# Patient Record
Sex: Female | Born: 1940 | Race: White | Hispanic: No | Marital: Single | Smoking: Never smoker
Health system: Southern US, Community
[De-identification: ages and names within clinical notes are randomized; demographics above are authoritative.]

## PROBLEM LIST (undated history)

## (undated) DIAGNOSIS — E079 Disorder of thyroid, unspecified: Secondary | ICD-10-CM

## (undated) DIAGNOSIS — Z85038 Personal history of other malignant neoplasm of large intestine: Secondary | ICD-10-CM

## (undated) DIAGNOSIS — C801 Malignant (primary) neoplasm, unspecified: Secondary | ICD-10-CM

## (undated) DIAGNOSIS — F0280 Dementia in other diseases classified elsewhere without behavioral disturbance: Secondary | ICD-10-CM

## (undated) DIAGNOSIS — Z853 Personal history of malignant neoplasm of breast: Secondary | ICD-10-CM

## (undated) DIAGNOSIS — G309 Alzheimer's disease, unspecified: Secondary | ICD-10-CM

## (undated) DIAGNOSIS — Z8541 Personal history of malignant neoplasm of cervix uteri: Secondary | ICD-10-CM

## (undated) HISTORY — PX: COLOSTOMY: SHX63

## (undated) HISTORY — PX: OTHER SURGICAL HISTORY: SHX169

---

## 2004-10-15 ENCOUNTER — Ambulatory Visit: Payer: Self-pay | Admitting: Obstetrics and Gynecology

## 2005-01-05 ENCOUNTER — Ambulatory Visit: Payer: Self-pay | Admitting: Unknown Physician Specialty

## 2005-10-18 ENCOUNTER — Ambulatory Visit: Payer: Self-pay | Admitting: Obstetrics and Gynecology

## 2005-11-04 ENCOUNTER — Ambulatory Visit: Payer: Self-pay | Admitting: Obstetrics and Gynecology

## 2006-10-20 ENCOUNTER — Ambulatory Visit: Payer: Self-pay | Admitting: Obstetrics and Gynecology

## 2007-08-15 ENCOUNTER — Ambulatory Visit: Payer: Self-pay | Admitting: Ophthalmology

## 2007-08-15 ENCOUNTER — Other Ambulatory Visit: Payer: Self-pay

## 2007-08-29 ENCOUNTER — Ambulatory Visit: Payer: Self-pay | Admitting: Ophthalmology

## 2007-10-24 ENCOUNTER — Ambulatory Visit: Payer: Self-pay | Admitting: Obstetrics and Gynecology

## 2007-11-17 ENCOUNTER — Emergency Department: Payer: Self-pay | Admitting: Emergency Medicine

## 2008-03-27 ENCOUNTER — Ambulatory Visit: Payer: Self-pay | Admitting: Unknown Physician Specialty

## 2009-01-22 ENCOUNTER — Ambulatory Visit: Payer: Self-pay | Admitting: Internal Medicine

## 2009-09-10 ENCOUNTER — Ambulatory Visit: Payer: Self-pay | Admitting: Ophthalmology

## 2009-09-23 ENCOUNTER — Ambulatory Visit: Payer: Self-pay | Admitting: Ophthalmology

## 2010-05-12 ENCOUNTER — Ambulatory Visit: Payer: Self-pay | Admitting: Internal Medicine

## 2011-05-19 ENCOUNTER — Ambulatory Visit: Payer: Self-pay | Admitting: Internal Medicine

## 2011-05-31 ENCOUNTER — Ambulatory Visit: Payer: Self-pay | Admitting: Internal Medicine

## 2011-07-05 ENCOUNTER — Ambulatory Visit: Payer: Self-pay | Admitting: Surgery

## 2011-07-20 ENCOUNTER — Ambulatory Visit: Payer: Self-pay | Admitting: Surgery

## 2011-07-26 ENCOUNTER — Ambulatory Visit: Payer: Self-pay | Admitting: Surgery

## 2011-07-27 LAB — PLATELET COUNT: Platelet: 234 10*3/uL (ref 150–440)

## 2011-07-28 LAB — PATHOLOGY REPORT

## 2011-08-02 LAB — PATHOLOGY REPORT

## 2012-05-30 ENCOUNTER — Ambulatory Visit: Payer: Self-pay | Admitting: Internal Medicine

## 2012-07-04 IMAGING — CR US BIOPSY BREAST CORE VACUUM ASSIST
7 of 8 series · 7 of 8 positions shown · non-contrast
Comparison: none

REASON FOR EXAM: RT BRST MICROCALS
COMMENTS:

[CC (1 of 7)]
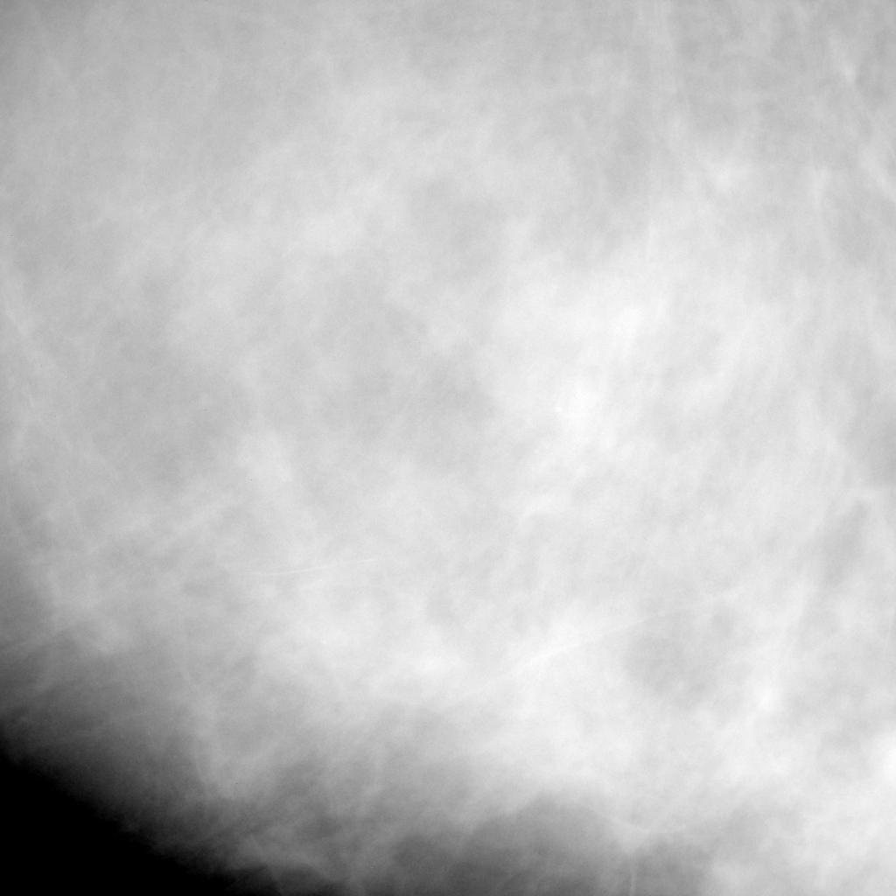

[CC (2 of 7)]
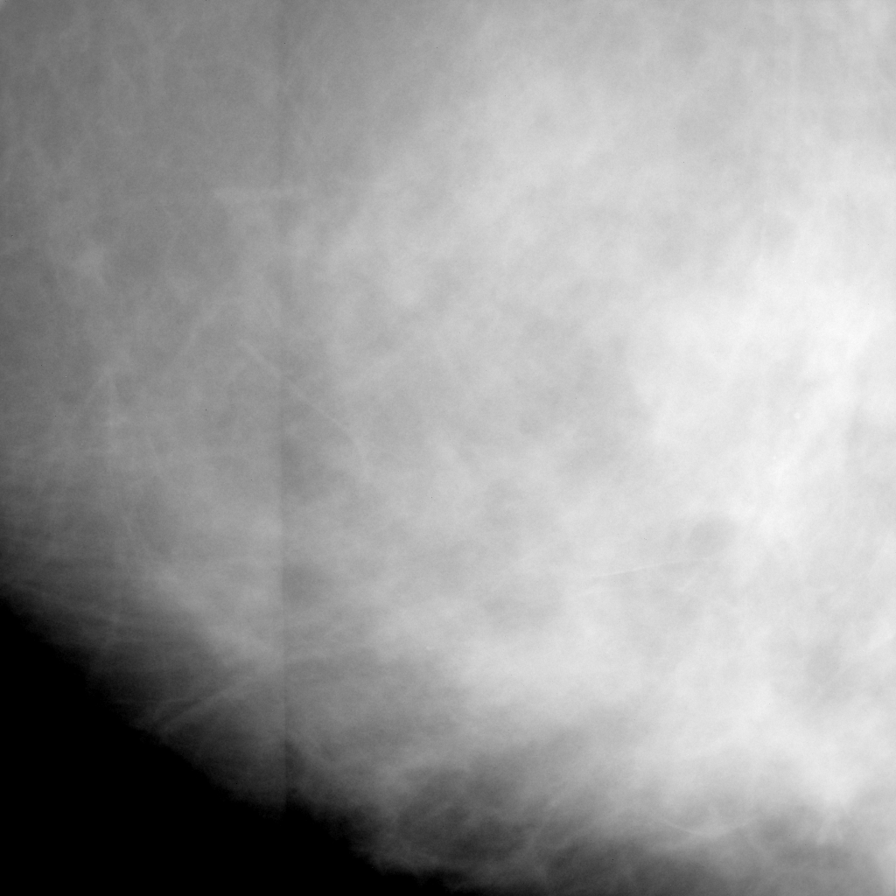

[CC (3 of 7)]
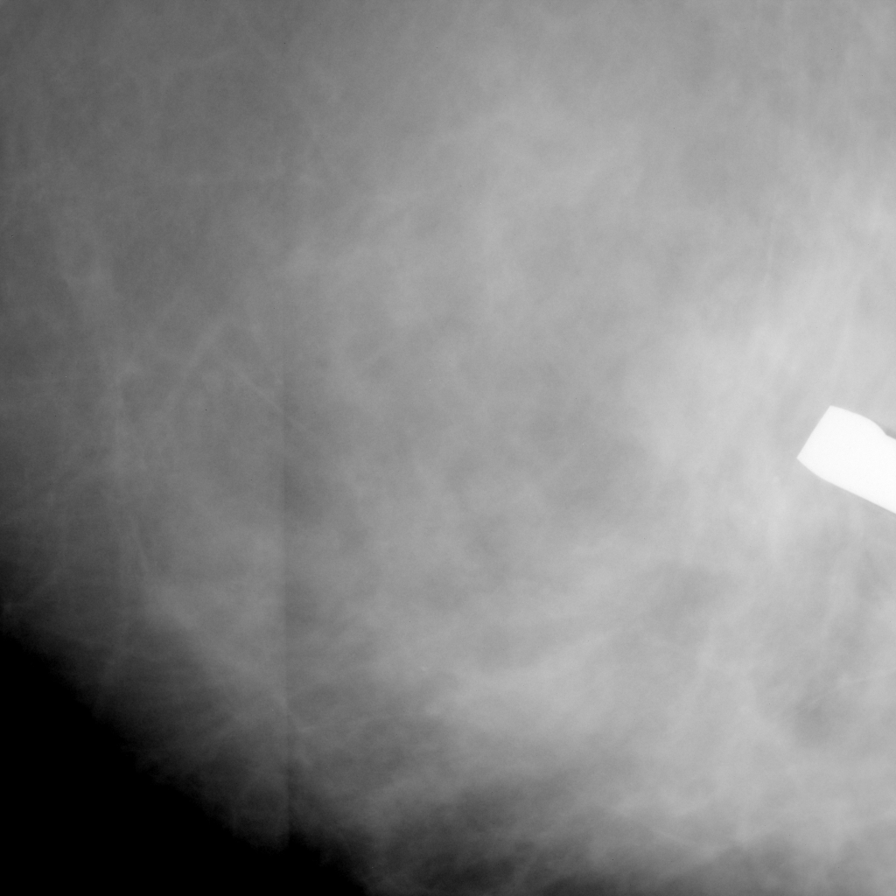

[CC (4 of 7)]
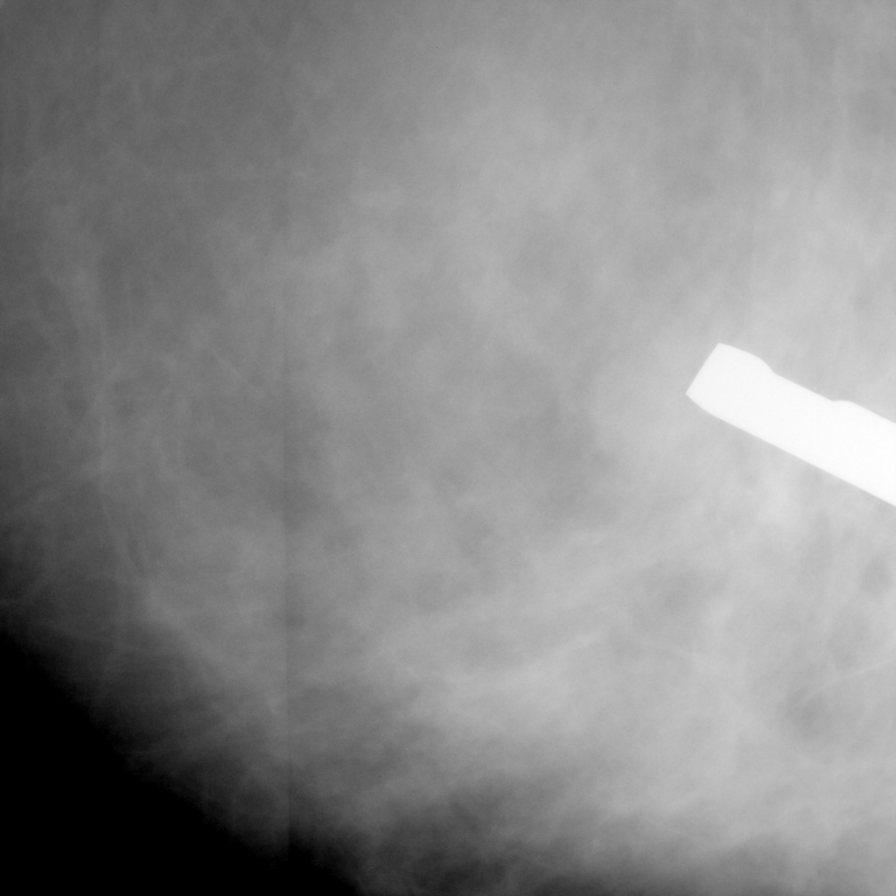

[CC (5 of 7)]
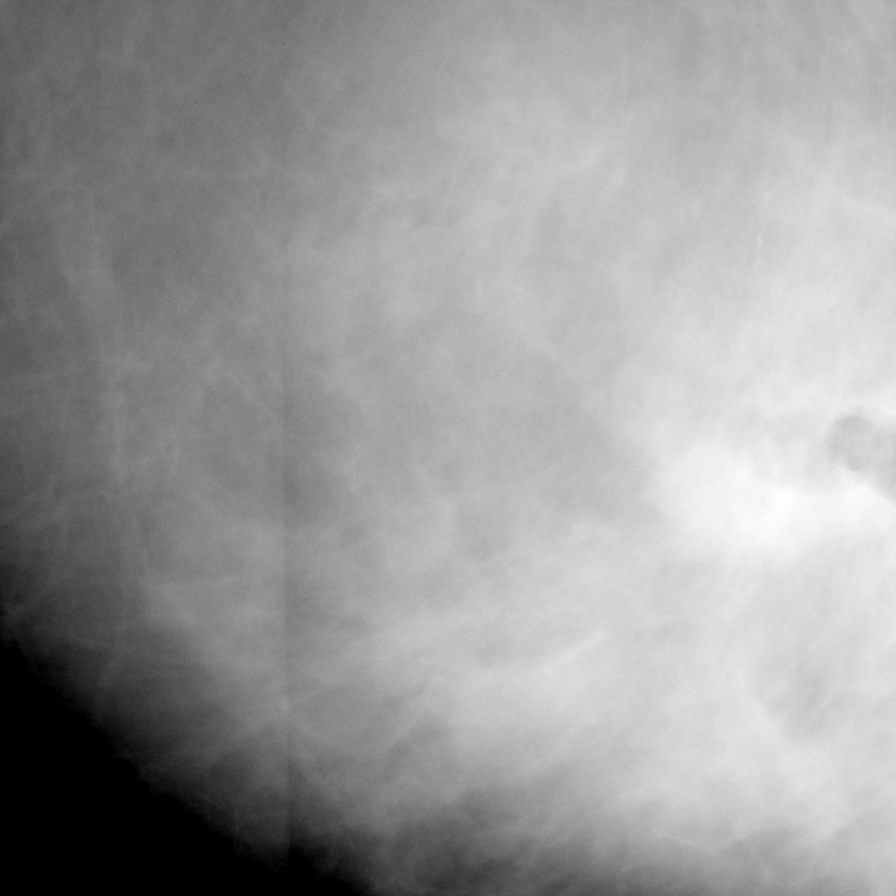

[CC (6 of 7)]
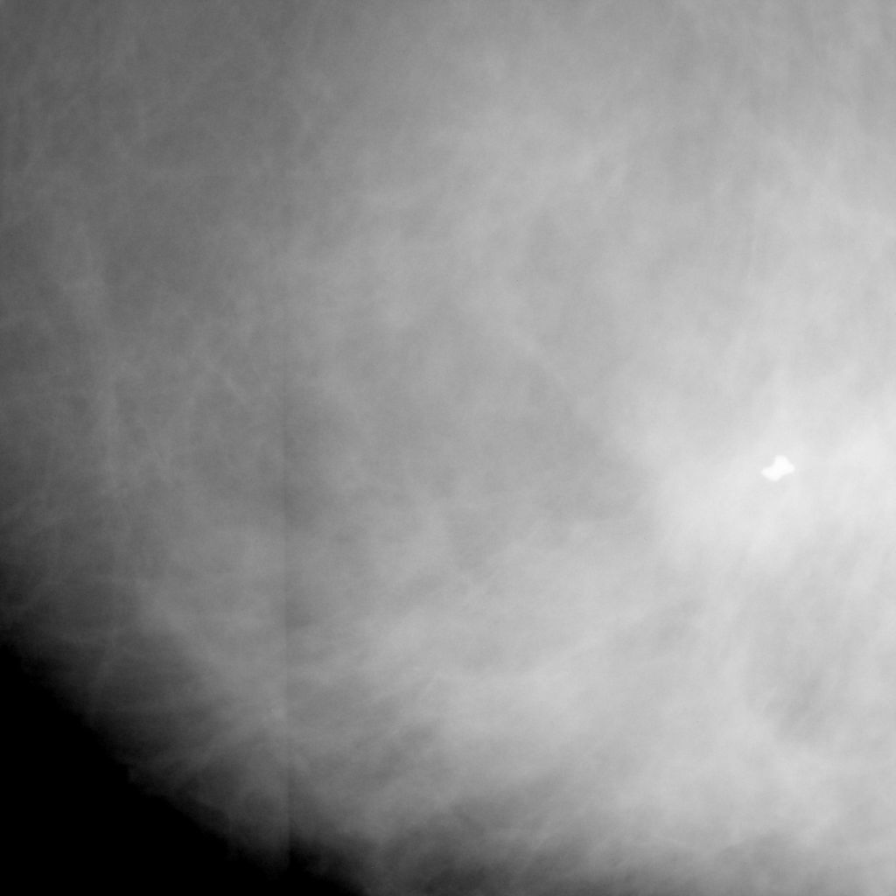

[CC (7 of 7)]
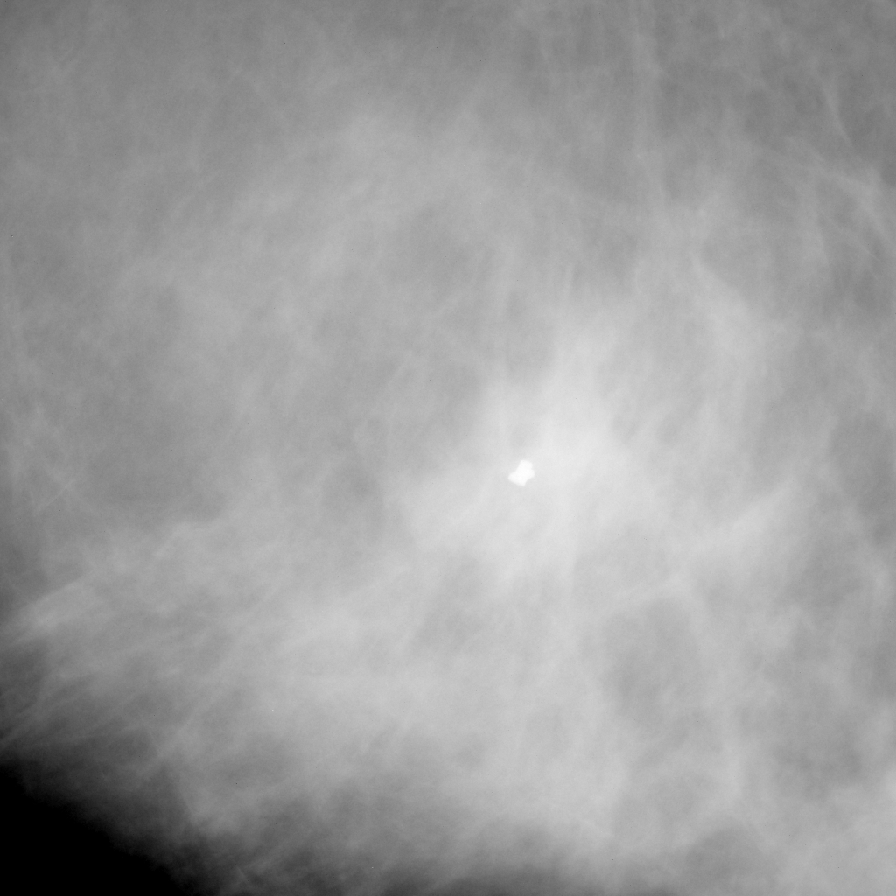

[7 of 8 positions shown; findings below may reference images not displayed]

PROCEDURE:     MAM - MAM STEREOTACTIC VACUUM ASSIST R  - July 05, 2011 [DATE]

RESULT:     The anticipated procedure was discussed with Ms. Erxleben. She
voiced her willingness to proceed. A timeout procedure was called. The skin
of the right breast was marked with an indelible marker. The skin was
cleansed with iodine solution and the subcutaneous tissues infiltrated with
approximately 2 cc of 1% buffered lidocaine. Subsequently a skin incision
was made and the stereotactic biopsy needle was placed under stereotactic
guidance. Positioning was confirmed and the needle deployed. Multiple core
samples were obtained. The specimen radiograph demonstrated the presence of
the intended targeted microcalcifications. A marker clip was deployed and
the needle withdrawn.
IMPRESSION: The patient underwent successful stereotactically directed
biopsy of microcalcifications in the right breast. The biopsy results will
go to Dr Elymalai. Should the biopsy prove benign, the patient will undergo
six-month followup mammography of the right breast to assure ongoing
stability.

Dictation location one

## 2014-04-12 DIAGNOSIS — E538 Deficiency of other specified B group vitamins: Secondary | ICD-10-CM | POA: Diagnosis not present

## 2014-06-30 NOTE — Op Note (Signed)
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   PROCEDURE: Right simple mastectomy.   ANESTHESIA: General.   SURGEON: Loreli Dollar, MD   INDICATIONS: This 74 year old female recently had an abnormal mammogram depicting a small nodule in the upper aspect of the right breast with microcalcifications. She had a core biopsy of this which demonstrated ductal carcinoma in-situ and she preferred to have a mastectomy for her definitive treatment.   DESCRIPTION OF PROCEDURE: The patient was placed on the operating table in the supine position under general anesthesia. The right arm was placed on a lateral arm support. The right breast was prepared with ChloraPrep and draped in a sterile manner.   An incision was made from medial to lateral above and below the areola and carried down through subcutaneous tissues. Next, skin and subcutaneous flaps were raised superiorly in the direction of the clavicle medially to the sternum, inferiorly to the inferior mammary fold, and laterally to the latissimus dorsi muscle. The skin was held with silk sutures for traction. A number of bleeding points were cauterized. A number of bleeding points were ligated with 3-0 Vicryl. The breast was dissected off the underlying fascia using electrocautery dissecting from medial to lateral and dissected out the axillary tail and tagged the lateral end of the skin ellipse with a stitch for the pathologist's orientation and submitted in formalin for routine pathology. Next, multiple clamped vessels were ligated with 3-0 Vicryl ties. Also, two vessels were ligated with 4-0 chromic suture ligatures. The wound was inspected. Several small bleeding points were cauterized. The wound was irrigated with saline solution. A small inferior  stab wound was made and a 62 [place] Blake drain was inserted and carried up to the axilla. A portion of it was cut away for sizing. Next, the skin was closed with a running 4-0 Monocryl subcuticular suture. 3-0 nylon sutures were placed to secure the drain. Next, the incision was dressed with Dermabond and also applied some Dermabond to the drain site and then after the Dermabond dried applied a gauze dressing around the drain site with 2-inch paper tape. The drain was activated and had scant serosanguineous drainage.   The patient tolerated the procedure satisfactorily and was then prepared for transfer to the recovery room.   ____________________________ Lenna Sciara. Rochel Brome, MD jws:drc D: 07/26/2011 13:26:47 ET T: 07/26/2011 13:31:59 ET JOB#: [id number]  cc: Loreli Dollar, MD, <Dictator> Loreli Dollar MD ELECTRONICALLY SIGNED 07/27/2011 7:45

## 2014-08-09 ENCOUNTER — Other Ambulatory Visit: Payer: Self-pay | Admitting: Neurology

## 2014-08-09 DIAGNOSIS — F0280 Dementia in other diseases classified elsewhere without behavioral disturbance: Secondary | ICD-10-CM

## 2014-08-09 DIAGNOSIS — G301 Alzheimer's disease with late onset: Secondary | ICD-10-CM

## 2014-08-09 DIAGNOSIS — R404 Transient alteration of awareness: Secondary | ICD-10-CM

## 2014-08-16 ENCOUNTER — Ambulatory Visit
Admission: RE | Admit: 2014-08-16 | Discharge: 2014-08-16 | Disposition: A | Discharge status: Home / Self Care | Payer: Medicare Other | Source: Ambulatory Visit | Attending: Neurology | Admitting: Neurology

## 2014-08-16 DIAGNOSIS — G301 Alzheimer's disease with late onset: Secondary | ICD-10-CM | POA: Insufficient documentation

## 2014-08-16 DIAGNOSIS — G311 Senile degeneration of brain, not elsewhere classified: Secondary | ICD-10-CM | POA: Insufficient documentation

## 2014-08-16 DIAGNOSIS — R404 Transient alteration of awareness: Secondary | ICD-10-CM | POA: Insufficient documentation

## 2014-08-16 DIAGNOSIS — I739 Peripheral vascular disease, unspecified: Secondary | ICD-10-CM | POA: Insufficient documentation

## 2014-08-16 DIAGNOSIS — F0280 Dementia in other diseases classified elsewhere without behavioral disturbance: Secondary | ICD-10-CM | POA: Diagnosis present

## 2015-07-02 ENCOUNTER — Other Ambulatory Visit: Payer: Self-pay | Admitting: Internal Medicine

## 2015-07-02 DIAGNOSIS — Z1231 Encounter for screening mammogram for malignant neoplasm of breast: Secondary | ICD-10-CM

## 2015-07-09 ENCOUNTER — Ambulatory Visit: Payer: Medicare Other | Attending: Internal Medicine

## 2016-05-11 ENCOUNTER — Inpatient Hospital Stay
Admission: EM | Admit: 2016-05-11 | Discharge: 2016-05-18 | DRG: 872 | Disposition: A | Discharge status: Home / Self Care | Payer: Medicare Other | Attending: Internal Medicine | Admitting: Internal Medicine

## 2016-05-11 ENCOUNTER — Emergency Department: Payer: Medicare Other

## 2016-05-11 ENCOUNTER — Encounter: Payer: Self-pay | Admitting: Emergency Medicine

## 2016-05-11 DIAGNOSIS — Z6820 Body mass index (BMI) 20.0-20.9, adult: Secondary | ICD-10-CM

## 2016-05-11 DIAGNOSIS — A86 Unspecified viral encephalitis: Secondary | ICD-10-CM | POA: Diagnosis present

## 2016-05-11 DIAGNOSIS — G309 Alzheimer's disease, unspecified: Secondary | ICD-10-CM | POA: Diagnosis present

## 2016-05-11 DIAGNOSIS — S0011XA Contusion of right eyelid and periocular area, initial encounter: Secondary | ICD-10-CM | POA: Diagnosis present

## 2016-05-11 DIAGNOSIS — S022XXA Fracture of nasal bones, initial encounter for closed fracture: Secondary | ICD-10-CM | POA: Diagnosis present

## 2016-05-11 DIAGNOSIS — Z85038 Personal history of other malignant neoplasm of large intestine: Secondary | ICD-10-CM

## 2016-05-11 DIAGNOSIS — E039 Hypothyroidism, unspecified: Secondary | ICD-10-CM | POA: Diagnosis present

## 2016-05-11 DIAGNOSIS — Z8541 Personal history of malignant neoplasm of cervix uteri: Secondary | ICD-10-CM

## 2016-05-11 DIAGNOSIS — Z9011 Acquired absence of right breast and nipple: Secondary | ICD-10-CM | POA: Diagnosis not present

## 2016-05-11 DIAGNOSIS — E860 Dehydration: Secondary | ICD-10-CM | POA: Diagnosis present

## 2016-05-11 DIAGNOSIS — E440 Moderate protein-calorie malnutrition: Secondary | ICD-10-CM | POA: Insufficient documentation

## 2016-05-11 DIAGNOSIS — E876 Hypokalemia: Secondary | ICD-10-CM | POA: Diagnosis present

## 2016-05-11 DIAGNOSIS — S0012XA Contusion of left eyelid and periocular area, initial encounter: Secondary | ICD-10-CM | POA: Diagnosis present

## 2016-05-11 DIAGNOSIS — H409 Unspecified glaucoma: Secondary | ICD-10-CM | POA: Diagnosis present

## 2016-05-11 DIAGNOSIS — Z79899 Other long term (current) drug therapy: Secondary | ICD-10-CM | POA: Diagnosis not present

## 2016-05-11 DIAGNOSIS — Z933 Colostomy status: Secondary | ICD-10-CM | POA: Diagnosis not present

## 2016-05-11 DIAGNOSIS — G039 Meningitis, unspecified: Secondary | ICD-10-CM | POA: Diagnosis present

## 2016-05-11 DIAGNOSIS — F0280 Dementia in other diseases classified elsewhere without behavioral disturbance: Secondary | ICD-10-CM | POA: Diagnosis present

## 2016-05-11 DIAGNOSIS — W010XXA Fall on same level from slipping, tripping and stumbling without subsequent striking against object, initial encounter: Secondary | ICD-10-CM | POA: Diagnosis present

## 2016-05-11 DIAGNOSIS — R066 Hiccough: Secondary | ICD-10-CM | POA: Diagnosis not present

## 2016-05-11 DIAGNOSIS — R509 Fever, unspecified: Secondary | ICD-10-CM

## 2016-05-11 DIAGNOSIS — Z853 Personal history of malignant neoplasm of breast: Secondary | ICD-10-CM

## 2016-05-11 DIAGNOSIS — Y92014 Private driveway to single-family (private) house as the place of occurrence of the external cause: Secondary | ICD-10-CM | POA: Diagnosis not present

## 2016-05-11 DIAGNOSIS — Z66 Do not resuscitate: Secondary | ICD-10-CM | POA: Diagnosis present

## 2016-05-11 DIAGNOSIS — T1490XA Injury, unspecified, initial encounter: Secondary | ICD-10-CM

## 2016-05-11 DIAGNOSIS — K529 Noninfective gastroenteritis and colitis, unspecified: Secondary | ICD-10-CM | POA: Diagnosis present

## 2016-05-11 DIAGNOSIS — A419 Sepsis, unspecified organism: Secondary | ICD-10-CM | POA: Diagnosis present

## 2016-05-11 DIAGNOSIS — D696 Thrombocytopenia, unspecified: Secondary | ICD-10-CM | POA: Diagnosis present

## 2016-05-11 HISTORY — DX: Personal history of other malignant neoplasm of large intestine: Z85.038

## 2016-05-11 HISTORY — DX: Disorder of thyroid, unspecified: E07.9

## 2016-05-11 HISTORY — DX: Personal history of malignant neoplasm of breast: Z85.3

## 2016-05-11 HISTORY — DX: Malignant (primary) neoplasm, unspecified: C80.1

## 2016-05-11 HISTORY — DX: Dementia in other diseases classified elsewhere, unspecified severity, without behavioral disturbance, psychotic disturbance, mood disturbance, and anxiety: F02.80

## 2016-05-11 HISTORY — DX: Alzheimer's disease, unspecified: G30.9

## 2016-05-11 HISTORY — DX: Personal history of malignant neoplasm of cervix uteri: Z85.41

## 2016-05-11 LAB — CSF CELL COUNT WITH DIFFERENTIAL
EOS CSF: 0 %
Eosinophils, CSF: 0 %
LYMPHS CSF: 42 %
Lymphs, CSF: 36 %
Monocyte-Macrophage-Spinal Fluid: 52 %
Monocyte-Macrophage-Spinal Fluid: 54 %
Other Cells, CSF: 1
Other Cells, CSF: 1
RBC Count, CSF: 0 /mm3 (ref 0–3)
RBC Count, CSF: 0 /mm3 (ref 0–3)
SEGMENTED NEUTROPHILS-CSF: 5 %
SEGMENTED NEUTROPHILS-CSF: 9 %
TUBE #: 4
Tube #: 1
WBC CSF: 50 /mm3 — AB (ref 0–5)
WBC, CSF: 13 /mm3 (ref 0–5)

## 2016-05-11 LAB — COMPREHENSIVE METABOLIC PANEL
ALBUMIN: 3.7 g/dL (ref 3.5–5.0)
ALT: 14 U/L (ref 14–54)
AST: 17 U/L (ref 15–41)
Alkaline Phosphatase: 70 U/L (ref 38–126)
Anion gap: 9 (ref 5–15)
BUN: 7 mg/dL (ref 6–20)
CHLORIDE: 95 mmol/L — AB (ref 101–111)
CO2: 31 mmol/L (ref 22–32)
Calcium: 8.5 mg/dL — ABNORMAL LOW (ref 8.9–10.3)
Creatinine, Ser: 0.94 mg/dL (ref 0.44–1.00)
GFR calc Af Amer: >60 mL/min (ref >60)
GFR, EST NON AFRICAN AMERICAN: 57 mL/min — AB (ref >60)
GLUCOSE: 172 mg/dL — AB (ref 65–99)
POTASSIUM: 3.3 mmol/L — AB (ref 3.5–5.1)
Sodium: 135 mmol/L (ref 135–145)
Total Bilirubin: 0.8 mg/dL (ref 0.3–1.2)
Total Protein: 7.2 g/dL (ref 6.5–8.1)

## 2016-05-11 LAB — URINALYSIS, COMPLETE (UACMP) WITH MICROSCOPIC
BACTERIA UA: NONE SEEN
BILIRUBIN URINE: NEGATIVE
Glucose, UA: NEGATIVE mg/dL
KETONES UR: 5 mg/dL — AB
LEUKOCYTES UA: NEGATIVE
Nitrite: NEGATIVE
PROTEIN: 100 mg/dL — AB
SQUAMOUS EPITHELIAL / LPF: NONE SEEN
Specific Gravity, Urine: 1.021 (ref 1.005–1.030)
pH: 5 (ref 5.0–8.0)

## 2016-05-11 LAB — CBC WITH DIFFERENTIAL/PLATELET
BASOS ABS: 0 10*3/uL (ref 0–0.1)
BASOS PCT: 1 %
EOS PCT: 1 %
Eosinophils Absolute: 0 10*3/uL (ref 0–0.7)
HCT: 38.3 % (ref 35.0–47.0)
Hemoglobin: 12.9 g/dL (ref 12.0–16.0)
LYMPHS PCT: 10 %
Lymphs Abs: 0.4 10*3/uL — ABNORMAL LOW (ref 1.0–3.6)
MCH: 28.1 pg (ref 26.0–34.0)
MCHC: 33.8 g/dL (ref 32.0–36.0)
MCV: 83.3 fL (ref 80.0–100.0)
Monocytes Absolute: 0.5 10*3/uL (ref 0.2–0.9)
Monocytes Relative: 11 %
NEUTROS ABS: 3.4 10*3/uL (ref 1.4–6.5)
Neutrophils Relative %: 77 %
PLATELETS: 185 10*3/uL (ref 150–440)
RBC: 4.59 MIL/uL (ref 3.80–5.20)
RDW: 14.2 % (ref 11.5–14.5)
WBC: 4.3 10*3/uL (ref 3.6–11.0)

## 2016-05-11 LAB — TSH: TSH: 3.56 u[IU]/mL (ref 0.350–4.500)

## 2016-05-11 LAB — INFLUENZA PANEL BY PCR (TYPE A & B)
INFLAPCR: NEGATIVE
INFLBPCR: NEGATIVE

## 2016-05-11 LAB — PROTEIN AND GLUCOSE, CSF
GLUCOSE CSF: 77 mg/dL — AB (ref 40–70)
Total  Protein, CSF: 48 mg/dL — ABNORMAL HIGH (ref 15–45)

## 2016-05-11 LAB — TROPONIN I

## 2016-05-11 MED ORDER — TIMOLOL HEMIHYDRATE 0.5 % OP SOLN
1.0000 [drp] | Freq: Every day | OPHTHALMIC | Status: DC
  Filled 2016-05-11: qty 5

## 2016-05-11 MED ORDER — LIDOCAINE HCL (PF) 1 % IJ SOLN
5.0000 mL | Freq: Once | INTRAMUSCULAR | Status: AC
  Administered 2016-05-11: 5 mL
  Filled 2016-05-11: qty 5

## 2016-05-11 MED ORDER — VITAMIN B-12 1000 MCG PO TABS
1000.0000 ug | ORAL_TABLET | ORAL | Status: DC
  Administered 2016-05-12 – 2016-05-18 (×4): 1000 ug via ORAL
  Filled 2016-05-11 (×4): qty 1

## 2016-05-11 MED ORDER — ENOXAPARIN SODIUM 40 MG/0.4ML ~~LOC~~ SOLN
40.0000 mg | SUBCUTANEOUS | Status: DC
  Administered 2016-05-11 – 2016-05-17 (×7): 40 mg via SUBCUTANEOUS
  Filled 2016-05-11 (×7): qty 0.4

## 2016-05-11 MED ORDER — SODIUM CHLORIDE 0.9 % IV SOLN
2.0000 g | Freq: Four times a day (QID) | INTRAVENOUS | Status: DC
  Administered 2016-05-12 (×3): 2 g via INTRAVENOUS
  Filled 2016-05-11 (×5): qty 2000

## 2016-05-11 MED ORDER — SODIUM CHLORIDE 0.9 % IV SOLN
1.0000 g | Freq: Once | INTRAVENOUS | Status: AC
  Administered 2016-05-11: 1 g via INTRAVENOUS
  Filled 2016-05-11: qty 1000

## 2016-05-11 MED ORDER — MEMANTINE HCL 10 MG PO TABS
5.0000 mg | ORAL_TABLET | Freq: Two times a day (BID) | ORAL | Status: DC
  Administered 2016-05-12 – 2016-05-17 (×11): 5 mg via ORAL
  Filled 2016-05-11 (×6): qty 1
  Filled 2016-05-11: qty 2
  Filled 2016-05-11 (×5): qty 1

## 2016-05-11 MED ORDER — DEXTROSE 5 % IV SOLN
2.0000 g | Freq: Once | INTRAVENOUS | Status: AC
  Administered 2016-05-11: 2 g via INTRAVENOUS
  Filled 2016-05-11: qty 2

## 2016-05-11 MED ORDER — ACETAMINOPHEN 325 MG PO TABS
650.0000 mg | ORAL_TABLET | Freq: Four times a day (QID) | ORAL | Status: DC | PRN
  Administered 2016-05-12 – 2016-05-16 (×3): 650 mg via ORAL
  Filled 2016-05-11 (×4): qty 2

## 2016-05-11 MED ORDER — VANCOMYCIN HCL 10 G IV SOLR
1500.0000 mg | Freq: Once | INTRAVENOUS | Status: AC
  Administered 2016-05-11: 1500 mg via INTRAVENOUS
  Filled 2016-05-11: qty 1500

## 2016-05-11 MED ORDER — ALBUTEROL SULFATE (2.5 MG/3ML) 0.083% IN NEBU: 2.5000 mg | INHALATION_SOLUTION | RESPIRATORY_TRACT | Status: DC | PRN

## 2016-05-11 MED ORDER — DEXTROSE 5 % IV SOLN
10.0000 mg/kg | Freq: Two times a day (BID) | INTRAVENOUS | Status: DC
  Administered 2016-05-12 – 2016-05-14 (×5): 545 mg via INTRAVENOUS
  Filled 2016-05-11 (×9): qty 10.9

## 2016-05-11 MED ORDER — LATANOPROST 0.005 % OP SOLN
1.0000 [drp] | Freq: Every day | OPHTHALMIC | Status: DC
  Administered 2016-05-12 – 2016-05-17 (×6): 1 [drp] via OPHTHALMIC
  Filled 2016-05-11: qty 2.5

## 2016-05-11 MED ORDER — DEXTROSE 5 % IV SOLN
2.0000 g | INTRAVENOUS | Status: DC
  Filled 2016-05-11: qty 2

## 2016-05-11 MED ORDER — POTASSIUM CHLORIDE IN NACL 20-0.9 MEQ/L-% IV SOLN
INTRAVENOUS | Status: AC
  Administered 2016-05-11: 22:00:00 via INTRAVENOUS
  Filled 2016-05-11: qty 1000

## 2016-05-11 MED ORDER — FLUTICASONE PROPIONATE 50 MCG/ACT NA SUSP
2.0000 | Freq: Every day | NASAL | Status: DC
  Administered 2016-05-12 – 2016-05-18 (×7): 2 via NASAL
  Filled 2016-05-11: qty 16

## 2016-05-11 MED ORDER — ONDANSETRON HCL 4 MG/2ML IJ SOLN: 4.0000 mg | Freq: Four times a day (QID) | INTRAMUSCULAR | Status: DC | PRN

## 2016-05-11 MED ORDER — ACETAMINOPHEN 650 MG RE SUPP: 650.0000 mg | Freq: Four times a day (QID) | RECTAL | Status: DC | PRN

## 2016-05-11 MED ORDER — ONDANSETRON HCL 4 MG PO TABS
4.0000 mg | ORAL_TABLET | Freq: Four times a day (QID) | ORAL | Status: DC | PRN
Start: 2016-05-11 — End: 2016-05-18

## 2016-05-11 MED ORDER — DEXTROSE 5 % IV SOLN: 2.0000 g | Freq: Once | INTRAVENOUS | Status: DC

## 2016-05-11 MED ORDER — DEXTROSE 5 % IV SOLN
2.0000 g | Freq: Two times a day (BID) | INTRAVENOUS | Status: DC
  Administered 2016-05-12: 04:00:00 2 g via INTRAVENOUS
  Filled 2016-05-11 (×2): qty 2

## 2016-05-11 MED ORDER — POLYETHYLENE GLYCOL 3350 17 G PO PACK: 17.0000 g | PACK | Freq: Every day | ORAL | Status: DC | PRN

## 2016-05-11 MED ORDER — VANCOMYCIN HCL IN DEXTROSE 750-5 MG/150ML-% IV SOLN
750.0000 mg | INTRAVENOUS | Status: DC
  Filled 2016-05-11: qty 150

## 2016-05-11 MED ORDER — GALANTAMINE HYDROBROMIDE 4 MG PO TABS
8.0000 mg | ORAL_TABLET | Freq: Two times a day (BID) | ORAL | Status: DC
  Administered 2016-05-12 – 2016-05-18 (×13): 8 mg via ORAL
  Filled 2016-05-11 (×15): qty 2

## 2016-05-11 MED ORDER — TRIAMCINOLONE ACETONIDE 55 MCG/ACT NA AERO: 2.0000 | INHALATION_SPRAY | Freq: Every day | NASAL | Status: DC

## 2016-05-11 MED ORDER — ONDANSETRON HCL 4 MG/2ML IJ SOLN: 4.0000 mg | Freq: Once | INTRAMUSCULAR | Status: DC

## 2016-05-11 MED ORDER — SODIUM CHLORIDE 0.9 % IV BOLUS (SEPSIS)
1000.0000 mL | Freq: Once | INTRAVENOUS | Status: AC
  Administered 2016-05-11: 1000 mL via INTRAVENOUS

## 2016-05-11 MED ORDER — ACETAMINOPHEN 500 MG PO TABS
1000.0000 mg | ORAL_TABLET | Freq: Once | ORAL | Status: AC
  Administered 2016-05-11: 1000 mg via ORAL
  Filled 2016-05-11: qty 2

## 2016-05-11 NOTE — ED Notes (Signed)
Pharm to send up omnipen, will hang when tubed to ED.

## 2016-05-11 NOTE — Consult Note (Signed)
Pharmacy Antibiotic Note  Donna Nielsen is a 76 y.o. female admitted on 05/11/2016 with possible meningitis.  Pharmacy has been consulted for vancomycin, acyclovir and ceftriaxone dosing. Pt also on ampicillin  Plan: Pt received 1500mg  of vancomycin in the ED, therefore no stacked dosing. Vancomycin 750mg  q 24 hours starting tomorrow at 2100 Acyclovir 10mg /kg actual body weight q 12 hr Ceftriaxone 2g q 12 hr vanc trough 3/10 @ 2030  Height: 5\' 4"  (162.6 cm) Weight: 115 lb 6.4 oz (52.3 kg) IBW/kg (Calculated) : 54.7  Temp (24hrs), Avg:99.1 F (37.3 C), Min:98.1 F (36.7 C), Max:100.1 F (37.8 C)   Recent Labs Lab 05/11/16 1318  WBC 4.3  CREATININE 0.94    Estimated Creatinine Clearance: 42 mL/min (by C-G formula based on SCr of 0.94 mg/dL).    No Known Allergies  Antimicrobials this admission: vancomycin 3/6 >>  ceftriaxone 3/6 >>  Ampicillin 3/6 >> Acyclovir 3/6 >>  Dose adjustments this admission:   Microbiology results: 3/6 CSF: 3/6 influenza neg  Thank you for allowing pharmacy to be a part of this patient's care.  Ramond Dial, Pharm.D, BCPS Clinical Pharmacist  05/11/2016 9:48 PM

## 2016-05-11 NOTE — H&P (Signed)
[facility] NAME: Donna Nielsen    MR#:  [id number]  DATE OF BIRTH:  1941/02/20  DATE OF ADMISSION:  05/11/2016  PRIMARY CARE PHYSICIAN: Rusty Aus, MD   REQUESTING/REFERRING PHYSICIAN: Dr. Alfred Levins  CHIEF COMPLAINT:   Chief Complaint  Patient presents with  . Fall    HISTORY OF PRESENT ILLNESS:  Donna Nielsen  is a 76 y.o. female with a known history of Hypothyroidism, Alzheimer's dementia presents to the emergency room brought in by the daughter due to worsening confusion and weakness with decreased oral intake.  Patient was seen recently 5 days back for a fall and had CT scan of the head which showed nasal bone fractures. She was started on tramadol and amoxicillin and discharged home. After going home patient has slowly gotten weaker and more confusion. Decreased oral intake. Daughter decided to bring patient back. She has not had any vomiting, diarrhea or rash, fever at home. Temperature 100.1 in the emergency room. Spinal tap shows WBCs of 50 and protein at 48.  Patient has no concerns. She is a poor historian. Chest x-ray is clear. Urinalysis shows no UTI.  PAST MEDICAL HISTORY:   Past Medical History:  Diagnosis Date  . Alzheimer disease   . History of breast cancer   . History of cervical cancer   . History of colon cancer   . Thyroid disease     PAST SURGICAL HISTORY:   Past Surgical History:  Procedure Laterality Date  . COLOSTOMY    . right mastectomy      SOCIAL HISTORY:   Social History  Substance Use Topics  . Smoking status: Never Smoker  . Smokeless tobacco: Never Used  . Alcohol use No    FAMILY HISTORY:  History reviewed. No pertinent family history.  DRUG ALLERGIES:  No Known Allergies  REVIEW OF SYSTEMS:   Review of Systems  Unable to perform ROS: Dementia    MEDICATIONS AT HOME:   Prior to Admission medications   Medication Sig Start Date End Date Taking? Authorizing Provider   amoxicillin (AMOXIL) 500 MG capsule Take 500 mg by mouth 2 (two) times daily. 05/08/16 05/17/16 Yes Historical Provider, MD  bimatoprost (LUMIGAN) 0.03 % ophthalmic solution Place 1 drop into both eyes at bedtime.   Yes Historical Provider, MD  galantamine (RAZADYNE) 8 MG tablet Take 8 mg by mouth 2 (two) times daily.   Yes Historical Provider, MD  memantine (NAMENDA) 5 MG tablet Take 5 mg by mouth 2 (two) times daily.   Yes Historical Provider, MD  timolol (BETIMOL) 0.5 % ophthalmic solution Place 1 drop into both eyes daily.   Yes Historical Provider, MD  triamcinolone (NASACORT ALLERGY 24HR) 55 MCG/ACT AERO nasal inhaler Place 2 sprays into the nose daily.   Yes Historical Provider, MD  vitamin B-12 (CYANOCOBALAMIN) 1000 MCG tablet Take 1,000 mcg by mouth every other day.   Yes Historical Provider, MD     VITAL SIGNS:  Blood pressure (!) 116/52, pulse 73, temperature 99.3 F (37.4 C), temperature source Oral, resp. rate (!) 21, height 5\' 2"  (1.575 m), weight 54.4 kg (120 lb), SpO2 95 %.  PHYSICAL EXAMINATION:  Physical Exam  GENERAL:  76 y.o.-year-old patient lying in the bed with no acute distress.  EYES: Bruising around the eyes. It was bilaterally equal and reactive to light HEENT: Trauma with bruising around the eyes and over NECK:  Supple, no jugular venous distention. No thyroid enlargement, no  tenderness.  LUNGS: Normal breath sounds bilaterally, no wheezing, rales, rhonchi. No use of accessory muscles of respiration.  CARDIOVASCULAR: S1, S2 normal. No murmurs, rubs, or gallops.  ABDOMEN: Soft, nontender, nondistended. Bowel sounds present. No organomegaly or mass.  EXTREMITIES: No pedal edema, cyanosis, or clubbing. + 2 pedal & radial pulses b/l.   NEUROLOGIC: Cranial nerves II through XII are intact. No focal Motor or sensory deficits appreciated b/l PSYCHIATRIC: The patient is alert and awake. Oriented to place but not time or person SKIN: No obvious rash, lesion, or ulcer.    LABORATORY PANEL:   CBC  Recent Labs Lab 05/11/16 1318  WBC 4.3  HGB 12.9  HCT 38.3  PLT 185   ------------------------------------------------------------------------------------------------------------------  Chemistries   Recent Labs Lab 05/11/16 1318  NA 135  K 3.3*  CL 95*  CO2 31  GLUCOSE 172*  BUN 7  CREATININE 0.94  CALCIUM 8.5*  AST 17  ALT 14  ALKPHOS 70  BILITOT 0.8   ------------------------------------------------------------------------------------------------------------------  Cardiac Enzymes  Recent Labs Lab 05/11/16 1318  TROPONINI <0.03   ------------------------------------------------------------------------------------------------------------------  RADIOLOGY:  Dg Chest 2 View  Result Date: 05/11/2016 CLINICAL DATA:  [place] Friday, now drowsy, altered mental status, history of dementia EXAM: CHEST  2 VIEW COMPARISON:  None. FINDINGS: No active infiltrate or effusion is seen. Mediastinal and hilar contours are unremarkable. The heart is borderline enlarged. No bony abnormality is seen. IMPRESSION: No active cardiopulmonary disease. Electronically Signed   By: Ivar Drape M.D.   On: 05/11/2016 17:03   Ct Head Wo Contrast  Result Date: 05/11/2016 CLINICAL DATA:  Fall Friday, head injury. Decreased responsiveness today. EXAM: CT HEAD WITHOUT CONTRAST TECHNIQUE: Contiguous axial images were obtained from the base of the skull through the vertex without intravenous contrast. COMPARISON:  CT head dated 05/07/2016.  Brain MRI dated 08/16/2014. FINDINGS: Brain: Generalized parenchymal atrophy with commensurate dilatation of the ventricles and sulci. Mild chronic small vessel ischemic change within the bilateral periventricular white matter regions. There is no mass, hemorrhage, edema or other evidence of acute parenchymal abnormality. No extra-axial hemorrhage. Vascular: There are chronic calcified atherosclerotic changes of the large vessels at the  skull base. No unexpected hyperdense vessel. Skull: Normal. Negative for fracture or focal lesion. Slightly displaced nasal bone fractures as described on the earlier CT report. Sinuses/Orbits: No acute finding. Other: Soft tissue edema overlying the left lower frontal bone, improved compared to the earlier CT. IMPRESSION: 1. No acute intracranial abnormality. No intracranial mass, hemorrhage or edema. No skull fracture. 2. Soft tissue edema overlying the lower left frontal bone, improved compared to earlier head CT of 05/07/2016. No underlying fracture. 3. Slightly displaced nasal bone fracture, as described on the earlier CT report. Electronically Signed   By: Franki Cabot M.D.   On: 05/11/2016 13:41   IMPRESSION AND PLAN:   * Meningitis Elevated WBC 50 and elevated protein. No neck rigidity This could also be reactive. We'll start vancomycin, ceftriaxone, ampicillin and acyclovir. Wait for CSF cultures and HSV PCR Consult infectious disease  * Alzheimer's dementia Watch for inpatient delirium  * Dehydration with hypokalemia due to decreased oral intake Start IV fluids.  * Weakness and worsening confusion Seems multifactorial due to acute infection, tramadol, dehydration  * Nasal bone fracture Conservative management  * DVT prophylaxis with Lovenox  All the records are reviewed and case discussed with ED provider. Management plans discussed with the patient, family and they are in agreement.  CODE STATUS: DNR  TOTAL TIME  TAKING CARE OF THIS PATIENT: 40 minutes.   Hillary Bow R M.D on 05/11/2016 at 8:31 PM  Between 7am to 6pm - Pager - [phone]  After 6pm go to [email or web address] - password [email or web address] [facility]  Office  [phone]  CC: Primary care physician; Rusty Aus, MD  Note: This dictation was prepared with Dragon dictation along with smaller phrase technology. Any transcriptional errors that result from this process are unintentional.

## 2016-05-11 NOTE — Progress Notes (Signed)
Advance care planning  Discussed with patient's daughter at bedside. She is her healthcare power of attorney. Patient due to her Alzheimer's dementia unable to make decisions.  Discussed with patient's daughter regarding treatment plan and prognosis. Course of Alzheimer's disease.  Patient has had progressive decline with her dementia over time. She has advanced directives regarding DO NOT RESUSCITATE and DO NOT INTUBATE.  Time spent 20 minutes

## 2016-05-11 NOTE — ED Triage Notes (Signed)
Pt fell Friday night and hit head.  Was seen at [facility]; had head CT.  granddaughter went and tried to wake pt up today and would not respond at all. Went back 30 min later and pulled pt up and got to shower and pt then fell asleep on toilet.  Drowsy compared to baseline per family.  Not talkative per family, slow to respond. Pt currently responding. Not on blood thinners. Disoriented to time only, hx alzheimer's. When stop talking to pt she falls asleep in triage. Raccoon eyes. If continually talk with pt will stay awake but when stop she will fall asleep. Discussed pt with dr Alfred Levins.

## 2016-05-11 NOTE — ED Provider Notes (Signed)
[facility] Emergency Department Provider Note  ____________________________________________  Time seen: Approximately 4:22 PM  I have reviewed the triage vital signs and the nursing notes.   HISTORY  Chief Complaint Fall   HPI Donna Nielsen is a 76 y.o. female with a history of Alzheimer's who presents for evaluation of altered mental status. Patient is accompanied by her daughter and granddaughter who take care of her. 4 days ago patient was walking down the driveway and she tripped and fell face forward hitting her forehead onto the concrete. She was seen at an outside hospital with CT head, face, cervical spine showing no acute intracranial abnormalities and a nasal bone fracture. She was placed on amoxicillin. Patient was doing well according to the family. Yesterday she started to become a little bit more confused and was speaking less. This morning when her granddaughter who takes care of her every morning came in she was unable to wake patient up. She was breathing and had a pulse. She thought that maybe patient was a little bit too tired and let her sleep for another hour. After that she was able to get her up however patient was very confused, not following commands, and disorientated which prompted their visit to the emergency room. Patient denies headache, chest pain, shortness of breath, abdominal pain. According to the family she has had no fever, she has been ambulating without difficulty, she has had no nausea, no vomiting, no diarrhea. She has had no cough or congestion.  Past Medical History:  Diagnosis Date  . Alzheimer disease   . History of breast cancer   . History of cervical cancer   . History of colon cancer   . Thyroid disease     There are no active problems to display for this patient.   Past Surgical History:  Procedure Laterality Date  . COLOSTOMY    . right mastectomy      Prior to Admission medications   Medication Sig  Start Date End Date Taking? Authorizing Provider  amoxicillin (AMOXIL) 500 MG capsule Take 500 mg by mouth 2 (two) times daily. 05/08/16 05/17/16 Yes Historical Provider, MD  bimatoprost (LUMIGAN) 0.03 % ophthalmic solution Place 1 drop into both eyes at bedtime.   Yes Historical Provider, MD  galantamine (RAZADYNE) 8 MG tablet Take 8 mg by mouth 2 (two) times daily.   Yes Historical Provider, MD  memantine (NAMENDA) 5 MG tablet Take 5 mg by mouth 2 (two) times daily.   Yes Historical Provider, MD  timolol (BETIMOL) 0.5 % ophthalmic solution Place 1 drop into both eyes daily.   Yes Historical Provider, MD  triamcinolone (NASACORT ALLERGY 24HR) 55 MCG/ACT AERO nasal inhaler Place 2 sprays into the nose daily.   Yes Historical Provider, MD  vitamin B-12 (CYANOCOBALAMIN) 1000 MCG tablet Take 1,000 mcg by mouth every other day.   Yes Historical Provider, MD    Allergies Patient has no known allergies.  History reviewed. No pertinent family history.  Social History Social History  Substance Use Topics  . Smoking status: Never Smoker  . Smokeless tobacco: Never Used  . Alcohol use No    Review of Systems  Constitutional: Negative for fever. + confusion Eyes: Negative for visual changes. ENT: Negative for sore throat. Neck: No neck pain  Cardiovascular: Negative for chest pain. Respiratory: Negative for shortness of breath. Gastrointestinal: Negative for abdominal pain, vomiting or diarrhea. Genitourinary: Negative for dysuria. Musculoskeletal: Negative for back pain. Skin: Negative for rash. Neurological:  Negative for headaches, weakness or numbness. Psych: No SI or HI  ____________________________________________   PHYSICAL EXAM:  VITAL SIGNS: ED Triage Vitals  Enc Vitals Group     BP 05/11/16 1317 128/69     Pulse Rate 05/11/16 1317 88     Resp 05/11/16 1317 16     Temp 05/11/16 1317 98.9 F (37.2 C)     Temp Source 05/11/16 1317 Oral     SpO2 05/11/16 1317 100 %      Weight 05/11/16 1310 120 lb (54.4 kg)     Height 05/11/16 1310 5\' 2"  (1.575 m)     Head Circumference --      Peak Flow --      Pain Score --      Pain Loc --      Pain Edu? --      Excl. in [place]? --     Constitutional: Alert and oriented x2 which is baseline, no distress, easily arousable, answers to questions appropriately.  HEENT:      Head: Normocephalic, bruising to the forehead.         Eyes: Conjunctivae are normal. Sclera is non-icteric. EOMI. PERRL. Bilateral periorbital hematomas      Mouth/Throat: Mucous membranes are moist.       Ear: No hemotympanum or battle sign      Neck: Supple with no signs of meningismus. No midline ttp Cardiovascular: Regular rate and rhythm. No murmurs, gallops, or rubs. 2+ symmetrical distal pulses are present in all extremities. No JVD. Respiratory: Normal respiratory effort. Lungs are clear to auscultation bilaterally. No wheezes, crackles, or rhonchi.  Gastrointestinal: Soft, non tender, and non distended with positive bowel sounds. No rebound or guarding. Musculoskeletal: Nontender with normal range of motion in all extremities. No edema, cyanosis, or erythema of extremities. Neurologic: Normal speech and language. A & O x3, PERRL, no nystagmus, CN II-XII intact, motor testing reveals good tone and bulk throughout. There is no evidence of pronator drift or dysmetria. Muscle strength is 5/5 throughout. Deep tendon reflexes are 2+ throughout with downgoing toes. Sensory examination is intact. Gait deferred Skin: Skin is warm, dry and intact. No rash noted. Psychiatric: Mood and affect are normal. Speech and behavior are normal.  ____________________________________________   LABS (all labs ordered are listed, but only abnormal results are displayed)  Labs Reviewed  CBC WITH DIFFERENTIAL/PLATELET - Abnormal; Notable for the following:       Result Value   Lymphs Abs 0.4 (*)    All other components within normal limits  COMPREHENSIVE METABOLIC  PANEL - Abnormal; Notable for the following:    Potassium 3.3 (*)    Chloride 95 (*)    Glucose, Bld 172 (*)    Calcium 8.5 (*)    GFR calc non Af Amer 57 (*)    All other components within normal limits  URINALYSIS, COMPLETE (UACMP) WITH MICROSCOPIC - Abnormal; Notable for the following:    Color, Urine YELLOW (*)    APPearance HAZY (*)    Hgb urine dipstick SMALL (*)    Ketones, ur 5 (*)    Protein, ur 100 (*)    All other components within normal limits  CSF CELL COUNT WITH DIFFERENTIAL - Abnormal; Notable for the following:    Appearance, CSF CLEAR (*)    WBC, CSF 50 (*)    All other components within normal limits  CSF CELL COUNT WITH DIFFERENTIAL - Abnormal; Notable for the following:    Appearance, CSF CLEAR (*)  WBC, CSF 13 (*)    All other components within normal limits  PROTEIN AND GLUCOSE, CSF - Abnormal; Notable for the following:    Glucose, CSF 77 (*)    Total  Protein, CSF 48 (*)    All other components within normal limits  CSF CULTURE  TROPONIN I  INFLUENZA PANEL BY PCR (TYPE A & B)  PATHOLOGIST SMEAR REVIEW   ____________________________________________  EKG  ED ECG REPORT I, Rudene Re, the attending physician, personally viewed and interpreted this ECG.  Normal sinus rhythm, rate of 82, normal intervals, left axis deviation, slight ST depressions on the inferior and lateral leads, no ST elevation. Slight ST depressions are new when compared to prior from 2013 ____________________________________________  RADIOLOGY  Head CT:  1. No acute intracranial abnormality. No intracranial mass, hemorrhage or edema. No skull fracture. 2. Soft tissue edema overlying the lower left frontal bone, improved compared to earlier head CT of 05/07/2016. No underlying fracture. 3. Slightly displaced nasal bone fracture, as described on the earlier CT report. ____________________________________________   PROCEDURES  Procedure(s) performed:yes .Lumbar  Puncture Date/Time: 05/11/2016 6:18 PM Performed by: Rudene Re Authorized by: Rudene Re   Consent:    Consent obtained:  Verbal   Consent given by:  Guardian   Risks discussed:  Bleeding, headache, nerve damage, infection, pain and repeat procedure Pre-procedure details:    Procedure purpose:  Diagnostic   Preparation: Patient was prepped and draped in usual sterile fashion   Anesthesia (see MAR for exact dosages):    Anesthesia method:  Local infiltration   Local anesthetic:  Lidocaine 1% w/o epi Procedure details:    Lumbar space:  L4-L5 interspace   Patient position:  R lateral decubitus   Needle gauge:  22   Needle type:  Spinal needle - Quincke tip   Number of attempts:  1   Fluid appearance:  Clear   Tubes of fluid:  4 Post-procedure:    Puncture site:  Adhesive bandage applied   Patient tolerance of procedure:  Tolerated well, no immediate complications   Critical Care performed: yes  CRITICAL CARE Performed by: Rudene Re  ?  Total critical care time: 35 min  Critical care time was exclusive of separately billable procedures and treating other patients.  Critical care was necessary to treat or prevent imminent or life-threatening deterioration.  Critical care was time spent personally by me on the following activities: development of treatment plan with patient and/or surrogate as well as nursing, discussions with consultants, evaluation of patient's response to treatment, examination of patient, obtaining history from patient or surrogate, ordering and performing treatments and interventions, ordering and review of laboratory studies, ordering and review of radiographic studies, pulse oximetry and re-evaluation of patient's condition.  ____________________________________________   INITIAL IMPRESSION / ASSESSMENT AND PLAN / ED COURSE  76 y.o. female with a history of Alzheimer's who presents for evaluation of altered mental status since  yesterday in the setting of a recent fall 4 days ago which patient sustained head trauma and nasal bone fracture. During my evaluation patient seemed warm to the touch and repeat temperature show a fever of 100.13F, patient is neurologically intact with no signs of basilar skull fracture. She does have bilateral periorbital hematoma but no tarsal plate sparing, she has no battle sign and no hemotympanum. Repeat head CT is negative for any acute findings. Patient could have a concussion from the fall however with low grade fever I feel that I must rule out an  infectious source. Patient has no other findings suggestive of an alternative infection based on history and exam. UA is negative. WBC normal. Will get Flu and CXR and if both negative, I am concerned that patient could have meningitis in the setting of recent face/ head trauma, AMS, low grade fever. Will give ceftriaxone now and continue to monitor.  Clinical Course as of May 11 1957  Tue May 11, 2016  1954 CSF studies showing 50 wbc's on tube 1 and 33 in tube 4 with no RBCs mostly lymphs but also neutrophils. Due to patient's altered mental status, fever, and recent trauma patient will be admitted until CSF cultures are back. We'll add vancomycin and ampicillin. Hospitalist will admit patient.  [CV]    Clinical Course User Index [CV] Rudene Re, MD    Pertinent labs & imaging results that were available during my care of the patient were reviewed by me and considered in my medical decision making (see chart for details).    ____________________________________________   FINAL CLINICAL IMPRESSION(S) / ED DIAGNOSES  Final diagnoses:  Meningitis      NEW MEDICATIONS STARTED DURING THIS VISIT:  New Prescriptions   No medications on file     Note:  This document was prepared using Dragon voice recognition software and may include unintentional dictation errors.    Rudene Re, MD 05/11/16 [phone]

## 2016-05-12 DIAGNOSIS — E440 Moderate protein-calorie malnutrition: Secondary | ICD-10-CM | POA: Insufficient documentation

## 2016-05-12 LAB — CBC
HCT: 42 % (ref 35.0–47.0)
Hemoglobin: 13.9 g/dL (ref 12.0–16.0)
MCH: 27.6 pg (ref 26.0–34.0)
MCHC: 33.1 g/dL (ref 32.0–36.0)
MCV: 83.5 fL (ref 80.0–100.0)
PLATELETS: 134 10*3/uL — AB (ref 150–440)
RBC: 5.03 MIL/uL (ref 3.80–5.20)
RDW: 14.2 % (ref 11.5–14.5)
WBC: 3.8 10*3/uL (ref 3.6–11.0)

## 2016-05-12 LAB — BASIC METABOLIC PANEL
Anion gap: 10 (ref 5–15)
BUN: 9 mg/dL (ref 6–20)
CALCIUM: 8.1 mg/dL — AB (ref 8.9–10.3)
CO2: 29 mmol/L (ref 22–32)
CREATININE: 0.7 mg/dL (ref 0.44–1.00)
Chloride: 97 mmol/L — ABNORMAL LOW (ref 101–111)
GFR calc Af Amer: >60 mL/min (ref >60)
Glucose, Bld: 124 mg/dL — ABNORMAL HIGH (ref 65–99)
POTASSIUM: 3.5 mmol/L (ref 3.5–5.1)
SODIUM: 136 mmol/L (ref 135–145)

## 2016-05-12 LAB — PATHOLOGIST SMEAR REVIEW

## 2016-05-12 MED ORDER — SODIUM CHLORIDE 0.9 % IV SOLN
3.0000 g | Freq: Four times a day (QID) | INTRAVENOUS | Status: DC
  Administered 2016-05-12 – 2016-05-17 (×19): 3 g via INTRAVENOUS
  Filled 2016-05-12 (×23): qty 3

## 2016-05-12 MED ORDER — TIMOLOL MALEATE 0.5 % OP SOLN
1.0000 [drp] | Freq: Every day | OPHTHALMIC | Status: DC
  Administered 2016-05-12 – 2016-05-18 (×7): 1 [drp] via OPHTHALMIC
  Filled 2016-05-12: qty 5

## 2016-05-12 MED ORDER — KCL IN DEXTROSE-NACL 20-5-0.45 MEQ/L-%-% IV SOLN
INTRAVENOUS | Status: DC
  Administered 2016-05-12 – 2016-05-17 (×9): via INTRAVENOUS
  Filled 2016-05-12 (×15): qty 1000

## 2016-05-12 MED ORDER — IBUPROFEN 200 MG PO TABS
400.0000 mg | ORAL_TABLET | Freq: Four times a day (QID) | ORAL | Status: DC | PRN
  Administered 2016-05-12: 21:00:00 400 mg via ORAL
  Filled 2016-05-12 (×2): qty 2

## 2016-05-12 MED ORDER — ENSURE ENLIVE PO LIQD
237.0000 mL | Freq: Three times a day (TID) | ORAL | Status: DC
  Administered 2016-05-12 – 2016-05-18 (×15): 237 mL via ORAL

## 2016-05-12 NOTE — Consult Note (Signed)
[facility] Infectious Disease     Reason for Consult:Meningitis     Referring Physician: Boykin Reaper Date of Admission:  05/11/2016   Active Problems:   Meningitis   HPI: Donna Nielsen is a 76 y.o. female admitted with fevers and confusion.  Patient was seen at [facility]  5 days ago for a fall when tripping on a curb. She  had CT scan of the head which showed nasal bone fractures. She was started on tramadol and amoxicillin and discharged home. After going home patient has slowly gotten weaker and more confusion. Decreased oral intake. Daughter decided to bring patient back when she was unable to be aroused. On admit temperature 100.1 in the emergency room. WBC 3.8, flu PCR neg, CXR neg,. CT head shows nasal bone fx and some soft tissue swelling.  UA neg, Spinal tap shows WBCs of 50->13 majority lymp and monos.  rbc 0.  protein at 48, glucose 77 Started on empiric meningitis treatment and IVF and much more alert per daughter at bedside.    Past Medical History:  Diagnosis Date  . Alzheimer disease   . History of breast cancer   . History of cervical cancer   . History of colon cancer   . Thyroid disease    Past Surgical History:  Procedure Laterality Date  . COLOSTOMY    . right mastectomy     Social History  Substance Use Topics  . Smoking status: Never Smoker  . Smokeless tobacco: Never Used  . Alcohol use No   History reviewed. No pertinent family history.  Allergies: No Known Allergies  Current antibiotics: Antibiotics Given (last 72 hours)    Date/Time Action Medication Dose Rate   05/12/16 0011 Given   ampicillin (OMNIPEN) 2 g in sodium chloride 0.9 % 50 mL IVPB 2 g 150 mL/hr   05/12/16 0149 Given   acyclovir (ZOVIRAX) 545 mg in dextrose 5 % 100 mL IVPB 545 mg 110.9 mL/hr   05/12/16 0335 Given   cefTRIAXone (ROCEPHIN) 2 g in dextrose 5 % 50 mL IVPB 2 g 100 mL/hr   05/12/16 0531 Given   ampicillin (OMNIPEN) 2 g in sodium chloride 0.9 % 50 mL IVPB 2 g 150  mL/hr   05/12/16 1206 Given   ampicillin (OMNIPEN) 2 g in sodium chloride 0.9 % 50 mL IVPB 2 g 150 mL/hr      MEDICATIONS: . acyclovir  10 mg/kg Intravenous Q12H  . ampicillin (OMNIPEN) IV  2 g Intravenous Q6H  . cefTRIAXone (ROCEPHIN) IVPB 2 gram/50 mL D5W (Pyxis)  2 g Intravenous Q12H  . enoxaparin (LOVENOX) injection  40 mg Subcutaneous Q24H  . feeding supplement (ENSURE ENLIVE)  237 mL Oral TID BM  . fluticasone  2 spray Each Nare Daily  . galantamine  8 mg Oral BID  . latanoprost  1 drop Both Eyes QHS  . memantine  5 mg Oral BID  . timolol  1 drop Both Eyes Daily  . vancomycin  750 mg Intravenous Q24H  . vitamin B-12  1,000 mcg Oral QODAY    Review of Systems - unable to obtain due to dementia  OBJECTIVE: Temp:  [98.1 F (36.7 C)-102.1 F (38.9 C)] 98.8 F (37.1 C) (03/07 1227) Pulse Rate:  [62-79] 72 (03/07 1227) Resp:  [13-22] 18 (03/07 1227) BP: (66-165)/(40-67) 122/44 (03/07 1227) SpO2:  [93 %-100 %] 97 % (03/07 1227) Weight:  [52.3 kg (115 lb 6.4 oz)-53.5 kg (118 lb)] 53.5 kg (118 lb) (03/07  0700) Physical Exam  Constitutional:  Thin, frail, dementia so cannot tell me where she is or year. Is able to converse clearly HENT: bilateral facial brusing periorbitally and on chin PERRLA, no scleral icterus Mouth/Throat: Oropharynx is clear and dry . No oropharyngeal exudate.  Cardiovascular: Normal rate, regular rhythm and normal heart sounds.Pulmonary/Chest: Effort normal and breath sounds normal. No respiratory distress.  has no wheezes.  Neck = supple, no nuchal rigidity Abdominal: Soft. Bowel sounds are normal.  Colostomy site wnl Lymphadenopathy: no cervical adenopathy. No axillary adenopathy Neurological: dementia, moves all 4, able to answer simple questions but does not know where she is Skin: Skin is warm and dry. brusing on face as above. Abrasions on knees bil but dry Psychiatric: a normal mood and affect. Marland Kitchen    LABS: Results for orders placed or performed  during the hospital encounter of 05/11/16 (from the past 48 hour(s))  CBC with Differential     Status: Abnormal   Collection Time: 05/11/16  1:18 PM  Result Value Ref Range   WBC 4.3 3.6 - 11.0 K/uL   RBC 4.59 3.80 - 5.20 MIL/uL   Hemoglobin 12.9 12.0 - 16.0 g/dL   HCT 38.3 35.0 - 47.0 %   MCV 83.3 80.0 - 100.0 fL   MCH 28.1 26.0 - 34.0 pg   MCHC 33.8 32.0 - 36.0 g/dL   RDW 14.2 11.5 - 14.5 %   Platelets 185 150 - 440 K/uL   Neutrophils Relative % 77 %   Neutro Abs 3.4 1.4 - 6.5 K/uL   Lymphocytes Relative 10 %   Lymphs Abs 0.4 (L) 1.0 - 3.6 K/uL   Monocytes Relative 11 %   Monocytes Absolute 0.5 0.2 - 0.9 K/uL   Eosinophils Relative 1 %   Eosinophils Absolute 0.0 0 - 0.7 K/uL   Basophils Relative 1 %   Basophils Absolute 0.0 0 - 0.1 K/uL  Comprehensive metabolic panel     Status: Abnormal   Collection Time: 05/11/16  1:18 PM  Result Value Ref Range   Sodium 135 135 - 145 mmol/L   Potassium 3.3 (L) 3.5 - 5.1 mmol/L   Chloride 95 (L) 101 - 111 mmol/L   CO2 31 22 - 32 mmol/L   Glucose, Bld 172 (H) 65 - 99 mg/dL   BUN 7 6 - 20 mg/dL   Creatinine, Ser 0.94 0.44 - 1.00 mg/dL   Calcium 8.5 (L) 8.9 - 10.3 mg/dL   Total Protein 7.2 6.5 - 8.1 g/dL   Albumin 3.7 3.5 - 5.0 g/dL   AST 17 15 - 41 U/L   ALT 14 14 - 54 U/L   Alkaline Phosphatase 70 38 - 126 U/L   Total Bilirubin 0.8 0.3 - 1.2 mg/dL   GFR calc non Af Amer 57 (L) >60 mL/min   GFR calc Af Amer >60 >60 mL/min    Comment: (NOTE) The eGFR has been calculated using the CKD EPI equation. This calculation has not been validated in all clinical situations. eGFR's persistently <60 mL/min signify possible Chronic Kidney Disease.    Anion gap 9 5 - 15  Troponin I     Status: None   Collection Time: 05/11/16  1:18 PM  Result Value Ref Range   Troponin I <0.03 <0.03 ng/mL  TSH     Status: None   Collection Time: 05/11/16  1:18 PM  Result Value Ref Range   TSH 3.560 0.350 - 4.500 uIU/mL    Comment: Performed by a 3rd  Generation assay with a functional sensitivity of <=0.01 uIU/mL.  Urinalysis, Complete w Microscopic     Status: Abnormal   Collection Time: 05/11/16  1:19 PM  Result Value Ref Range   Color, Urine YELLOW (A) YELLOW   APPearance HAZY (A) CLEAR   Specific Gravity, Urine 1.021 1.005 - 1.030   pH 5.0 5.0 - 8.0   Glucose, UA NEGATIVE NEGATIVE mg/dL   Hgb urine dipstick SMALL (A) NEGATIVE   Bilirubin Urine NEGATIVE NEGATIVE   Ketones, ur 5 (A) NEGATIVE mg/dL   Protein, ur 100 (A) NEGATIVE mg/dL   Nitrite NEGATIVE NEGATIVE   Leukocytes, UA NEGATIVE NEGATIVE   RBC / HPF 0-5 0 - 5 RBC/hpf   WBC, UA 0-5 0 - 5 WBC/hpf   Bacteria, UA NONE SEEN NONE SEEN   Squamous Epithelial / LPF NONE SEEN NONE SEEN   Mucous PRESENT    Granular Casts, UA PRESENT   Influenza panel by PCR (type A & B)     Status: None   Collection Time: 05/11/16  4:28 PM  Result Value Ref Range   Influenza A By PCR NEGATIVE NEGATIVE   Influenza B By PCR NEGATIVE NEGATIVE    Comment: (NOTE) The Xpert Xpress Flu assay is intended as an aid in the diagnosis of  influenza and should not be used as a sole basis for treatment.  This  assay is FDA approved for nasopharyngeal swab specimens only. Nasal  washings and aspirates are unacceptable for Xpert Xpress Flu testing.   CSF cell count with differential collection tube #: 1     Status: Abnormal   Collection Time: 05/11/16  6:12 PM  Result Value Ref Range   Tube # 1    Color, CSF COLORLESS COLORLESS   Appearance, CSF CLEAR (A) CLEAR   RBC Count, CSF 0 0 - 3 /cu mm   WBC, CSF 50 (HH) 0 - 5 /cu mm    Comment: CRITICAL RESULT CALLED TO, READ BACK BY AND VERIFIED WITH: Martinique LOYE AT 1947 ON 05/11/16 BY SNJ    Segmented Neutrophils-CSF 9 %   Lymphs, CSF 36 %   Monocyte-Macrophage-Spinal Fluid 54 %   Eosinophils, CSF 0 %   Other Cells, CSF 1   CSF cell count with differential collection tube #: 4     Status: Abnormal   Collection Time: 05/11/16  6:12 PM  Result Value Ref  Range   Tube # 4    Color, CSF COLORLESS COLORLESS   Appearance, CSF CLEAR (A) CLEAR   RBC Count, CSF 0 0 - 3 /cu mm   WBC, CSF 13 (HH) 0 - 5 /cu mm    Comment: CRITICAL RESULT CALLED TO, READ BACK BY AND VERIFIED WITH: Martinique LOYE AT 1947 ON 05/11/16 BY SNJ    Segmented Neutrophils-CSF 5 %   Lymphs, CSF 42 %   Monocyte-Macrophage-Spinal Fluid 52 %   Eosinophils, CSF 0 %   Other Cells, CSF 1   CSF culture     Status: None (Preliminary result)   Collection Time: 05/11/16  6:12 PM  Result Value Ref Range   Specimen Description CSF    Special Requests NONE    Gram Stain NO ORGANISMS SEEN FEW WBC     Culture      NO GROWTH < 24 HOURS Performed at [facility], [place] [address], [place], [place] [address]    Report Status PENDING   Protein and glucose, CSF     Status: Abnormal  Collection Time: 05/11/16  6:12 PM  Result Value Ref Range   Glucose, CSF 77 (H) 40 - 70 mg/dL   Total  Protein, CSF 48 (H) 15 - 45 mg/dL  Pathologist smear review     Status: None   Collection Time: 05/11/16  6:12 PM  Result Value Ref Range   Path Review       Cytospin slide from CSF tube no. 1 reviewed. There are macrophages, lymphocytes, and a few neutrophils. This sample is negative for malignant cells.    Comment: Reviewed by Lemmie Evens. Dicie Beam, MD.  Basic metabolic panel     Status: Abnormal   Collection Time: 05/12/16  4:50 AM  Result Value Ref Range   Sodium 136 135 - 145 mmol/L   Potassium 3.5 3.5 - 5.1 mmol/L   Chloride 97 (L) 101 - 111 mmol/L   CO2 29 22 - 32 mmol/L   Glucose, Bld 124 (H) 65 - 99 mg/dL   BUN 9 6 - 20 mg/dL   Creatinine, Ser 0.70 0.44 - 1.00 mg/dL   Calcium 8.1 (L) 8.9 - 10.3 mg/dL   GFR calc non Af Amer >60 >60 mL/min   GFR calc Af Amer >60 >60 mL/min    Comment: (NOTE) The eGFR has been calculated using the CKD EPI equation. This calculation has not been validated in all clinical situations. eGFR's persistently <60 mL/min signify possible Chronic Kidney Disease.     Anion gap 10 5 - 15  CBC     Status: Abnormal   Collection Time: 05/12/16  4:50 AM  Result Value Ref Range   WBC 3.8 3.6 - 11.0 K/uL   RBC 5.03 3.80 - 5.20 MIL/uL   Hemoglobin 13.9 12.0 - 16.0 g/dL   HCT 42.0 35.0 - 47.0 %   MCV 83.5 80.0 - 100.0 fL   MCH 27.6 26.0 - 34.0 pg   MCHC 33.1 32.0 - 36.0 g/dL   RDW 14.2 11.5 - 14.5 %   Platelets 134 (L) 150 - 440 K/uL   No components found for: ESR, C REACTIVE PROTEIN MICRO: Recent Results (from the past 720 hour(s))  CSF culture     Status: None (Preliminary result)   Collection Time: 05/11/16  6:12 PM  Result Value Ref Range Status   Specimen Description CSF  Final   Special Requests NONE  Final   Gram Stain NO ORGANISMS SEEN FEW WBC   Final   Culture   Final    NO GROWTH < 24 HOURS Performed at [facility], [address] [place], [place] [address]    Report Status PENDING  Incomplete    IMAGING: Dg Chest 2 View  Result Date: 05/11/2016 CLINICAL DATA:  [place] Friday, now drowsy, altered mental status, history of dementia EXAM: CHEST  2 VIEW COMPARISON:  None. FINDINGS: No active infiltrate or effusion is seen. Mediastinal and hilar contours are unremarkable. The heart is borderline enlarged. No bony abnormality is seen. IMPRESSION: No active cardiopulmonary disease. Electronically Signed   By: Ivar Drape M.D.   On: 05/11/2016 17:03   Ct Head Wo Contrast  Result Date: 05/11/2016 CLINICAL DATA:  Fall Friday, head injury. Decreased responsiveness today. EXAM: CT HEAD WITHOUT CONTRAST TECHNIQUE: Contiguous axial images were obtained from the base of the skull through the vertex without intravenous contrast. COMPARISON:  CT head dated 05/07/2016.  Brain MRI dated 08/16/2014. FINDINGS: Brain: Generalized parenchymal atrophy with commensurate dilatation of the ventricles and sulci. Mild chronic small vessel ischemic  change within the bilateral periventricular white matter regions. There is no mass, hemorrhage, edema or other  evidence of acute parenchymal abnormality. No extra-axial hemorrhage. Vascular: There are chronic calcified atherosclerotic changes of the large vessels at the skull base. No unexpected hyperdense vessel. Skull: Normal. Negative for fracture or focal lesion. Slightly displaced nasal bone fractures as described on the earlier CT report. Sinuses/Orbits: No acute finding. Other: Soft tissue edema overlying the left lower frontal bone, improved compared to the earlier CT. IMPRESSION: 1. No acute intracranial abnormality. No intracranial mass, hemorrhage or edema. No skull fracture. 2. Soft tissue edema overlying the lower left frontal bone, improved compared to earlier head CT of 05/07/2016. No underlying fracture. 3. Slightly displaced nasal bone fracture, as described on the earlier CT report. Electronically Signed   By: Franki Cabot M.D.   On: 05/11/2016 13:41    Assessment:   Donna Nielsen is a 76 y.o. female with Alzheimers, cared for at home by family admitted with AMS and now a fever to 102. She had a fall a few days ago with facial contusion and nasal bone fx. CXR neg, CT neg, ua neg, flu pcr neg. LP done but CSF is not very impressive with a mild lymphocytic predomniance pleocytosis, mild elevation protein.  I do not think she has a bacterial meningitis. She may have viral or HSV encephalitis.  Facial infection from her fx is possible No evidence UTI or PNA but could have aspiration when altered.  Recommendations Await CSF cx and HSV PCR Would dc vanco and ceftriaxone Change amp to unasyn to cover listeria and also broader coverage Cont acyclovir for now Can deescalate tomorrow if  Thank you very much for allowing me to participate in the care of this patient. Please call with questions.   Cheral Marker. Ola Spurr, MD

## 2016-05-12 NOTE — Progress Notes (Signed)
[facility] [place] NAME: Donna Nielsen    MR#:  [id number]  DATE OF BIRTH:  [birth year]  SUBJECTIVE:  CHIEF COMPLAINT:   Chief Complaint  Patient presents with  . Fall   The patient is 76 year old Caucasian female with past medical history significant for history ofhypothyroidism, Alzheimer's dementia, breast cancer, cervical cancer, who presents to the hospital with confusion, weakness, decreased oral intake. Patient was seen in the emergency room about 5 days after fall, at that time. CT of head revealed nasal bone fractures. She was initiated on tramadol and amoxicillin and discharged home, however, she became weaker and more confused and was brought back. Her temperature was noted to be 100.1. In emergency room. By the cell count on LP was 50. Patient was admitted to the hospital for meningitis. She was initiated on broad-spectrum antibiotic therapy. She denies any headaches at present, feels comfortable Review of Systems  Unable to perform ROS: Dementia    VITAL SIGNS: Blood pressure (!) 122/44, pulse 72, temperature 98.8 F (37.1 C), temperature source Oral, resp. rate 18, height 5\' 4"  (1.626 m), weight 53.5 kg (118 lb), SpO2 97 %.  PHYSICAL EXAMINATION:   GENERAL:  76 y.o.-year-old patient lying in the bed with no acute distress. Nasal and periorbital bruising EYES: Pupils equal, round, reactive to light and accommodation. No scleral icterus. Extraocular muscles intact.  HEENT: Head atraumatic, normocephalic. Oropharynx and nasopharynx clear. No nuchal rigidity NECK:  Supple, no jugular venous distention. No thyroid enlargement, no tenderness.  LUNGS: Normal breath sounds bilaterally, no wheezing, rales,rhonchi or crepitation. No use of accessory muscles of respiration.  CARDIOVASCULAR: S1, S2 normal. No murmurs, rubs, or gallops.  ABDOMEN: Soft, nontender, nondistended. Bowel sounds present. No organomegaly or mass.   EXTREMITIES: No pedal edema, cyanosis, or clubbing.  NEUROLOGIC: Cranial nerves II through XII are intact. Muscle strength 5/5 in all extremities. Sensation intact. Gait not checked.  PSYCHIATRIC: The patient is alert and oriented x 3.  SKIN: No obvious rash, lesion, or ulcer.   ORDERS/RESULTS REVIEWED:   CBC  Recent Labs Lab 05/11/16 1318 05/12/16 0450  WBC 4.3 3.8  HGB 12.9 13.9  HCT 38.3 42.0  PLT 185 134*  MCV 83.3 83.5  MCH 28.1 27.6  MCHC 33.8 33.1  RDW 14.2 14.2  LYMPHSABS 0.4*  --   MONOABS 0.5  --   EOSABS 0.0  --   BASOSABS 0.0  --    ------------------------------------------------------------------------------------------------------------------  Chemistries   Recent Labs Lab 05/11/16 1318 05/12/16 0450  NA 135 136  K 3.3* 3.5  CL 95* 97*  CO2 31 29  GLUCOSE 172* 124*  BUN 7 9  CREATININE 0.94 0.70  CALCIUM 8.5* 8.1*  AST 17  --   ALT 14  --   ALKPHOS 70  --   BILITOT 0.8  --    ------------------------------------------------------------------------------------------------------------------ estimated creatinine clearance is 50.5 mL/min (by C-G formula based on SCr of 0.7 mg/dL). ------------------------------------------------------------------------------------------------------------------  Recent Labs  05/11/16 1318  TSH 3.560    Cardiac Enzymes  Recent Labs Lab 05/11/16 1318  TROPONINI <0.03   ------------------------------------------------------------------------------------------------------------------ Invalid input(s): POCBNP ---------------------------------------------------------------------------------------------------------------  RADIOLOGY: Dg Chest 2 View  Result Date: 05/11/2016 CLINICAL DATA:  [place], now drowsy, altered mental status, history of dementia EXAM: CHEST  2 VIEW COMPARISON:  None. FINDINGS: No active infiltrate or effusion is seen. Mediastinal and hilar contours are unremarkable. The heart is  borderline enlarged. No bony abnormality is seen. IMPRESSION: No  active cardiopulmonary disease. Electronically Signed   By: Ivar Drape M.D.   On: 05/11/2016 17:03   Ct Head Wo Contrast  Result Date: 05/11/2016 CLINICAL DATA:  Fall Friday, head injury. Decreased responsiveness today. EXAM: CT HEAD WITHOUT CONTRAST TECHNIQUE: Contiguous axial images were obtained from the base of the skull through the vertex without intravenous contrast. COMPARISON:  CT head dated 05/07/2016.  Brain MRI dated 08/16/2014. FINDINGS: Brain: Generalized parenchymal atrophy with commensurate dilatation of the ventricles and sulci. Mild chronic small vessel ischemic change within the bilateral periventricular white matter regions. There is no mass, hemorrhage, edema or other evidence of acute parenchymal abnormality. No extra-axial hemorrhage. Vascular: There are chronic calcified atherosclerotic changes of the large vessels at the skull base. No unexpected hyperdense vessel. Skull: Normal. Negative for fracture or focal lesion. Slightly displaced nasal bone fractures as described on the earlier CT report. Sinuses/Orbits: No acute finding. Other: Soft tissue edema overlying the left lower frontal bone, improved compared to the earlier CT. IMPRESSION: 1. No acute intracranial abnormality. No intracranial mass, hemorrhage or edema. No skull fracture. 2. Soft tissue edema overlying the lower left frontal bone, improved compared to earlier head CT of 05/07/2016. No underlying fracture. 3. Slightly displaced nasal bone fracture, as described on the earlier CT report. Electronically Signed   By: Franki Cabot M.D.   On: 05/11/2016 13:41    EKG:  Orders placed or performed during the hospital encounter of 05/11/16  . EKG 12-Lead  . EKG 12-Lead  . ED EKG  . ED EKG    ASSESSMENT AND PLAN:  Active Problems:   Meningitis  #1. Meningitis, CSF cultures are negative so far, blood cultures were not taken, continue antibiotic therapy  with Rocephin, ampicillin, vancomycin, antiviral Acyclovir, ID consultation is requested, supportive therapy  #2Malnutrition in the setting of acute illness, initiate patient on IV fluids, dietary consultation is requested #3. Hypokalemia, supplemented intravenously, improved #4. Thrombocytopenia, follow with therapy #5. Hyperglycemia, get hemoglobin A1c to rule out diabetes #6. Dementia, supportive therapy, discussed this patient's daughter, all questions were answered   Management plans discussed with the patient, family and they are in agreement.   DRUG ALLERGIES: No Known Allergies  CODE STATUS:     Code Status Orders        Start     Ordered   05/11/16 2027  Do not attempt resuscitation (DNR)  Continuous    Question Answer Comment  In the event of cardiac or respiratory ARREST Do not call a "code blue"   In the event of cardiac or respiratory ARREST Do not perform Intubation, CPR, defibrillation or ACLS   In the event of cardiac or respiratory ARREST Use medication by any route, position, wound care, and other measures to relive pain and suffering. May use oxygen, suction and manual treatment of airway obstruction as needed for comfort.      05/11/16 2027    Code Status History    Date Active Date Inactive Code Status Order ID Comments User Context   This patient has a current code status but no historical code status.    Advance Directive Documentation   Flowsheet Row Most Recent Value  Type of Advance Directive  Healthcare Power of Attorney  Pre-existing out of facility DNR order (yellow form or pink MOST form)  No data  "MOST" Form in Place?  No data      TOTAL TIME TAKING CARE OF THIS PATIENT: 40 minutes.    Theodoro Grist M.D on 05/12/2016  at 2:01 PM  Between 7am to 6pm - Pager - [phone]  After 6pm go to [email or web address] - password [email or web address] [facility]  Office  [phone]  CC: Primary care physician; Rusty Aus, MD

## 2016-05-12 NOTE — Progress Notes (Signed)
Pharmacy Antibiotic Note  Donna Nielsen is a 76 y.o. female admitted on 05/11/2016 with possible aspiration pneumonia.  Pharmacy has been consulted for Unasyn dosing. Initially, patient was being treated for possible meningitis with Vancomycin, Ceftriaxone, Ampicillin, and Acyclovir.  Plan: Unasyn 3g IV q6h  Height: 5\' 4"  (162.6 cm) Weight: 118 lb (53.5 kg) IBW/kg (Calculated) : 54.7  Temp (24hrs), Avg:99.3 F (37.4 C), Min:98.1 F (36.7 C), Max:102.1 F (38.9 C)   Recent Labs Lab 05/11/16 1318 05/12/16 0450  WBC 4.3 3.8  CREATININE 0.94 0.70    Estimated Creatinine Clearance: 50.5 mL/min (by C-G formula based on SCr of 0.7 mg/dL).    No Known Allergies  Antimicrobials this admission: Vancomycin 3/6 >> 3/7 Ceftriaxone 3/6 >> 3/7 Ampicillin 3/6 >> 3/7 Acyclovir 3/6 >> Unasyn 3/6 >>   Thank you for allowing pharmacy to be a part of this patient's care.  Paulina Fusi, PharmD, BCPS 05/12/2016 3:27 PM

## 2016-05-12 NOTE — Progress Notes (Signed)
Initial Nutrition Assessment  DOCUMENTATION CODES:   Non-severe (moderate) malnutrition in context of chronic illness  INTERVENTION:  Continue Ensure Enlive po TID, each supplement provides 350 kcal and 20 grams of protein.   Encouraged adequate intake of calories and protein at meals.   NUTRITION DIAGNOSIS:   Inadequate oral intake related to poor appetite as evidenced by per patient/family report.  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 Recent head CT after fall showing nasal bone fractures. Patient found to have possible meningitis.   -Per chart patient has had a colostomy since 1988 due to history of colon cancer.  Spoke with patient and daughter at bedside. Patient is not a great historian but daughter able to give history. Per daughter patient has been eating poorly for the past year. She used to eat a lot of sweets and candy but when she was diagnosed with DM she stopped eating these and began losing a lot of weight. Now patient has very poor appetite and requires encouragement to eat her meals. MD had already ordered Ensure for patient, which she had finished drinking during RD assessment. Daughter reports patient enjoys chocolate flavor. Denies N/V, abdominal pain, difficulty chewing/swallowing. Discussed importance of adequate calories and protein to prevent further weight loss. Daughter reports patient does not like milk and would not want it to come on trays.   Per daughter UBW was 160 lbs. Per chart patient was 143 lbs (64.9 kg) on 09/30/2014 on the visit  patient was diagnosed with DM type 2 and started on metformin (A1c was 7.6 and fasting blood glucose was 164). She was then 119 lbs (54 kg) on 01/07/2016 and HgbA1c was 6.5 so metformin was discontinued (patient was having diarrhea, abdominal pain, poor PO intake). Patient lost 24 lbs (16.8% body weight) over one year and 4 months, which is not significant for time frame. Per chart patient has been at approximately the same weight since she originally lost it.   Medications reviewed and include: acyclovir, ceftriaxone, vancomycin, vitamin B12 1000 micrograms every other day, D5-1/2NS with KCl 20 mEq/L @ 100 ml/hr (2.4 L, 120 grams dextrose, 408 kcal daily).  Labs reviewed: Chloride 97.  Nutrition-Focused physical exam completed. Findings are moderate fat depletion, moderate-severe muscle depletion, and no edema.   Discussed with RN. Patient was previously on regular diet, unsure why downgraded to full liquids.   Diet Order:  Diet full liquid Room service appropriate? Yes; Fluid consistency: Thin  Skin:  Reviewed, no issues  Last BM:  05/12/2016 - ostomy  Height:   Ht Readings from Last 1 Encounters:  05/11/16 5\' 4"  (1.626 m)    Weight:   Wt Readings from Last 1 Encounters:  05/12/16 118 lb (53.5 kg)    Ideal Body Weight:  54.5 kg  BMI:  Body mass index is 20.25 kg/m.  Estimated Nutritional Needs:   Kcal:  1220-1420 (MSJ x 1.2-1.4)  Protein:  65-75 grams (1.2-1.4 grams/kg)  Fluid:  1.3 L/day (25 ml/kg)  EDUCATION NEEDS:   No education needs identified at this time  Willey Blade, MS, RD, LDN Pager: [phone] After Hours Pager:  [phone]  

## 2016-05-13 LAB — BASIC METABOLIC PANEL
ANION GAP: 17 — AB (ref 5–15)
BUN: 10 mg/dL (ref 6–20)
CHLORIDE: 93 mmol/L — AB (ref 101–111)
CO2: 26 mmol/L (ref 22–32)
Calcium: 7.8 mg/dL — ABNORMAL LOW (ref 8.9–10.3)
Creatinine, Ser: 0.94 mg/dL (ref 0.44–1.00)
GFR, EST NON AFRICAN AMERICAN: 57 mL/min — AB (ref >60)
Glucose, Bld: 160 mg/dL — ABNORMAL HIGH (ref 65–99)
POTASSIUM: 3.4 mmol/L — AB (ref 3.5–5.1)
SODIUM: 136 mmol/L (ref 135–145)

## 2016-05-13 LAB — HEMOGLOBIN A1C
HEMOGLOBIN A1C: 6.2 % — AB (ref 4.8–5.6)
MEAN PLASMA GLUCOSE: 131 mg/dL

## 2016-05-13 LAB — PROCALCITONIN: Procalcitonin: 0.69 ng/mL

## 2016-05-13 LAB — HERPES SIMPLEX VIRUS(HSV) DNA BY PCR
HSV 1 DNA: NEGATIVE
HSV 2 DNA: NEGATIVE

## 2016-05-13 MED ORDER — IBUPROFEN 200 MG PO TABS
400.0000 mg | ORAL_TABLET | Freq: Four times a day (QID) | ORAL | Status: DC | PRN
  Administered 2016-05-13 – 2016-05-15 (×6): 400 mg via ORAL
  Filled 2016-05-13 (×6): qty 2

## 2016-05-13 MED ORDER — VANCOMYCIN HCL IN DEXTROSE 750-5 MG/150ML-% IV SOLN
750.0000 mg | INTRAVENOUS | Status: DC
  Administered 2016-05-13 – 2016-05-16 (×4): 750 mg via INTRAVENOUS
  Filled 2016-05-13 (×4): qty 150

## 2016-05-13 NOTE — Progress Notes (Signed)
Pharmacy Antibiotic Note  Donna Nielsen is a 76 y.o. female admitted on 05/11/2016 with meningitis.  Pharmacy has been consulted to restart Vancomycin dosing. Currently ordered Unasyn 3g IV q6h and Acyclovir.  Plan: Vancomycin 750mg  IV every 24 hours.  Goal trough 15-20 mcg/mL.Trough prior to 4th dose.  Height: 5\' 4"  (162.6 cm) Weight: 121 lb 8 oz (55.1 kg) IBW/kg (Calculated) : 54.7  Temp (24hrs), Avg:100.6 F (38.1 C), Min:98.4 F (36.9 C), Max:102.5 F (39.2 C)   Recent Labs Lab 05/11/16 1318 05/12/16 0450 05/13/16 0443  WBC 4.3 3.8  --   CREATININE 0.94 0.70 0.94    Estimated Creatinine Clearance: 44 mL/min (by C-G formula based on SCr of 0.94 mg/dL).    No Known Allergies  Antimicrobials this admission: Vancomycin 3/6 >> 3/7 >> restarted 3/8 >> Ceftriaxone 3/6 >> 3/7 Ampicillin 3/6 >> 3/7 Acyclovir 3/6 >>  Unasyn 3/6 >>  Microbiology results: 3/6 CSF: 3/6 influenza neg  Thank you for allowing pharmacy to be a part of this patient's care.  Paulina Fusi, PharmD, BCPS 05/13/2016 2:01 PM

## 2016-05-13 NOTE — Care Management Important Message (Signed)
Important Message  Patient Details  Name: Donna Nielsen MRN: [id number] Date of Birth: 06/27/1940   Medicare Important Message Given:  Yes    Shelbie Ammons, RN 05/13/2016, 9:33 AM

## 2016-05-13 NOTE — Progress Notes (Signed)
[facility] [place] NAME: Donna Nielsen    MR#:  [id number]  DATE OF BIRTH:  [birth year]  SUBJECTIVE:  CHIEF COMPLAINT:   Chief Complaint  Patient presents with  . Fall    REVIEW OF SYSTEMS:  Review of Systems  Constitutional: Positive for chills and fever. Negative for malaise/fatigue.  HENT: Negative for congestion, ear discharge and hearing loss.   Eyes: Negative for blurred vision.  Respiratory: Negative for cough, shortness of breath and wheezing.   Cardiovascular: Negative for chest pain, palpitations and leg swelling.  Gastrointestinal: Negative for abdominal pain, constipation, diarrhea, nausea and vomiting.  Genitourinary: Negative for dysuria.  Musculoskeletal: Negative for myalgias.  Neurological: Negative for dizziness, sensory change, speech change, focal weakness, seizures and headaches.  Psychiatric/Behavioral: Negative for depression.    DRUG ALLERGIES:  No Known Allergies  VITALS:  Blood pressure (!) 119/46, pulse 92, temperature 98.8 F (37.1 C), temperature source Oral, resp. rate 18, height 5\' 4"  (1.626 m), weight 55.1 kg (121 lb 8 oz), SpO2 96 %.  PHYSICAL EXAMINATION:  Physical Exam  GENERAL:  76 y.o.-year-old patient lying in the bed with no acute distress.  EYES: Pupils equal, round, reactive to light and accommodation. No scleral icterus. Extraocular muscles intact.  HEENT: Head atraumatic, normocephalic. Oropharynx and nasopharynx clear. Bruising around her eyes Ears look clean. NECK:  Supple, no jugular venous distention. No thyroid enlargement, no tenderness.  LUNGS: Normal breath sounds bilaterally, no wheezing, rales,rhonchi or crepitation. No use of accessory muscles of respiration.  CARDIOVASCULAR: S1, S2 normal. No rubs, or gallops. 2/6 systolic murmur present. ABDOMEN: Soft, nontender, nondistended. Bowel sounds present. No organomegaly or mass.  EXTREMITIES: No pedal edema, cyanosis, or  clubbing.  NEUROLOGIC: Cranial nerves II through XII are intact. Muscle strength 5/5 in all extremities. Sensation intact. Gait not checked. Following simple commands PSYCHIATRIC: The patient is alert and oriented x 2.  SKIN: No obvious rash, lesion, or ulcer.    LABORATORY PANEL:   CBC  Recent Labs Lab 05/12/16 0450  WBC 3.8  HGB 13.9  HCT 42.0  PLT 134*   ------------------------------------------------------------------------------------------------------------------  Chemistries   Recent Labs Lab 05/11/16 1318  05/13/16 0443  NA 135  < > 136  K 3.3*  < > 3.4*  CL 95*  < > 93*  CO2 31  < > 26  GLUCOSE 172*  < > 160*  BUN 7  < > 10  CREATININE 0.94  < > 0.94  CALCIUM 8.5*  < > 7.8*  AST 17  --   --   ALT 14  --   --   ALKPHOS 70  --   --   BILITOT 0.8  --   --   < > = values in this interval not displayed. ------------------------------------------------------------------------------------------------------------------  Cardiac Enzymes  Recent Labs Lab 05/11/16 1318  TROPONINI <0.03   ------------------------------------------------------------------------------------------------------------------  RADIOLOGY:  Dg Chest 2 View  Result Date: 05/11/2016 CLINICAL DATA:  [place] Friday, now drowsy, altered mental status, history of dementia EXAM: CHEST  2 VIEW COMPARISON:  None. FINDINGS: No active infiltrate or effusion is seen. Mediastinal and hilar contours are unremarkable. The heart is borderline enlarged. No bony abnormality is seen. IMPRESSION: No active cardiopulmonary disease. Electronically Signed   By: Ivar Drape M.D.   On: 05/11/2016 17:03   Ct Head Wo Contrast  Result Date: 05/11/2016 CLINICAL DATA:  Fall Friday, head injury. Decreased responsiveness today. EXAM: CT HEAD WITHOUT CONTRAST TECHNIQUE:  Contiguous axial images were obtained from the base of the skull through the vertex without intravenous contrast. COMPARISON:  CT head dated 05/07/2016.   Brain MRI dated 08/16/2014. FINDINGS: Brain: Generalized parenchymal atrophy with commensurate dilatation of the ventricles and sulci. Mild chronic small vessel ischemic change within the bilateral periventricular white matter regions. There is no mass, hemorrhage, edema or other evidence of acute parenchymal abnormality. No extra-axial hemorrhage. Vascular: There are chronic calcified atherosclerotic changes of the large vessels at the skull base. No unexpected hyperdense vessel. Skull: Normal. Negative for fracture or focal lesion. Slightly displaced nasal bone fractures as described on the earlier CT report. Sinuses/Orbits: No acute finding. Other: Soft tissue edema overlying the left lower frontal bone, improved compared to the earlier CT. IMPRESSION: 1. No acute intracranial abnormality. No intracranial mass, hemorrhage or edema. No skull fracture. 2. Soft tissue edema overlying the lower left frontal bone, improved compared to earlier head CT of 05/07/2016. No underlying fracture. 3. Slightly displaced nasal bone fracture, as described on the earlier CT report. Electronically Signed   By: Franki Cabot M.D.   On: 05/11/2016 13:41    EKG:   Orders placed or performed during the hospital encounter of 05/11/16  . EKG 12-Lead  . EKG 12-Lead  . ED EKG  . ED EKG    ASSESSMENT AND PLAN:   76 year old female with past medical history significant for Alzheimer's dementia, history of breast cancer, hypothyroidism presents to hospital secondary to an unresponsive episode. Noted to be septic.  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 Continues to spike fevers. -Blood cultures are negative. CSF cultures and HSV PCR are pending. -No other source of infection identified. Influenza PCR is negative. -Appreciate ID consult. Continue Unasyn and acyclovir for now  #2 dementia-alert and active at baseline. Still not back to baseline. Continue Galantamine and Namenda.  #3 glaucoma-continue eyedrops  #4  hypokalemia-being replaced  #5 DVT prophylaxis-Lovenox   Physical Therapy   All the records are reviewed and case discussed with Care Management/Social Workerr. Management plans discussed with the patient, family and they are in agreement.  CODE STATUS: DNR  TOTAL TIME TAKING CARE OF THIS PATIENT: 38 minutes.   POSSIBLE D/C IN 2 DAYS, DEPENDING ON CLINICAL CONDITION.   Gladstone Lighter M.D on 05/13/2016 at 1:13 PM  Between 7am to 6pm - Pager - [phone]  After 6pm go to [email or web address] - password [email or web address] [facility]  Office  [phone]  CC: Primary care physician; Rusty Aus, MD

## 2016-05-13 NOTE — Plan of Care (Signed)
Problem: Bowel/Gastric: Goal: Will not experience complications related to bowel motility Outcome: Progressing Pt spiked a temp around 1820. Off going nurse gave Tylenol. Recheck was still 102.5. MD notified. Ibuprofen 800 mg ordered. Given per MD order. Temp improved to 98.5. Otherwise, uneventful night. Will continue to monitor.

## 2016-05-13 NOTE — Evaluation (Signed)
Physical Therapy Evaluation Patient Details Name: MARJO GROSVENOR MRN: [id number] DOB: 05/28/1940 Today's Date: 05/13/2016   History of Present Illness  76 y/o female who was seen ~ 1 weeks ago after fall.  Went home and returns with increased confusion and some weakness.   Clinical Impression  Pt is able to ambulate well with no AD, no fatigue, no LOBs and generally was at her baseline with no issues.  She negotiate up down steps, walked at community appropriate speed, was able to don socks and had no issues t/o the session.  She did have some impulsivity but had no issues related to PT.  Pt does not need further PT intervention and daughter agrees.      Follow Up Recommendations No PT follow up    Equipment Recommendations       Recommendations for Other Services       Precautions / Restrictions Precautions Precautions: Fall Restrictions Weight Bearing Restrictions: No      Mobility  Bed Mobility Overal bed mobility: Independent             General bed mobility comments: easily able to get up to sitting EOB  Transfers Overall transfer level: Independent Equipment used: None             General transfer comment: Pt rises to standing w/o hesitation and other than general impulsivity was safe  Ambulation/Gait Ambulation/Gait assistance: Independent Ambulation Distance (Feet): 250 Feet Assistive device: None       General Gait Details: Pt walks with exceptional confidence and speed and overall is safe and near her baseline.  She has no LOBs, no fatigue and ultimately did well.  Stairs Stairs: Yes Stairs assistance: Independent Stair Management: One rail Right Number of Stairs: 4 General stair comments: Pt was able to easily negotiate up/down steps with reciprocal strategy and good confidence.   Wheelchair Mobility    Modified Rankin (Stroke Patients Only)       Balance Overall balance assessment: Independent                                            Pertinent Vitals/Pain Pain Assessment: No/denies pain    Home Living Family/patient expects to be discharged to:: Private residence Living Arrangements: Children;Other relatives Available Help at Discharge: Family (nearly 24/7 assist, available if needed) Type of Home: House       Home Layout: Two level        Prior Function Level of Independence: Independent         Comments: generally active and independent     Hand Dominance        Extremity/Trunk Assessment   Upper Extremity Assessment Upper Extremity Assessment: Overall WFL for tasks assessed    Lower Extremity Assessment Lower Extremity Assessment: Overall WFL for tasks assessed       Communication   Communication: No difficulties  Cognition Arousal/Alertness: Awake/alert (pt did not know the date, some confusion) Behavior During Therapy: Impulsive Overall Cognitive Status: History of cognitive impairments - at baseline                 General Comments: daughter reports that she does seem a little more confused than normal    General Comments      Exercises     Assessment/Plan    PT Assessment Patent does not need any further PT services  PT  Problem List         PT Treatment Interventions      PT Goals (Current goals can be found in the Care Plan section)  Acute Rehab PT Goals Patient Stated Goal: go home PT Goal Formulation: All assessment and education complete, DC therapy    Frequency     Barriers to discharge        Co-evaluation               End of Session Equipment Utilized During Treatment: Gait belt Activity Tolerance: Patient tolerated treatment well Patient left: with call bell/phone within reach;with chair alarm set;with family/visitor present Nurse Communication: Mobility status PT Visit Diagnosis: Muscle weakness (generalized) (M62.81)         Time: 8118-8677 PT Time Calculation (min) (ACUTE ONLY): 11 min   Charges:   PT  Evaluation $PT Eval Low Complexity: 1 Procedure     PT G CodesKreg Shropshire, DPT 05/13/2016, 5:23 PM

## 2016-05-14 ENCOUNTER — Inpatient Hospital Stay: Payer: Medicare Other

## 2016-05-14 ENCOUNTER — Encounter: Payer: Self-pay | Admitting: Radiology

## 2016-05-14 LAB — CBC WITH DIFFERENTIAL/PLATELET
Basophils Absolute: 0 10*3/uL (ref 0–0.1)
Basophils Relative: 1 %
EOS ABS: 0.1 10*3/uL (ref 0–0.7)
EOS PCT: 2 %
HCT: 32.5 % — ABNORMAL LOW (ref 35.0–47.0)
Hemoglobin: 11 g/dL — ABNORMAL LOW (ref 12.0–16.0)
LYMPHS ABS: 0.6 10*3/uL — AB (ref 1.0–3.6)
LYMPHS PCT: 20 %
MCH: 28.2 pg (ref 26.0–34.0)
MCHC: 33.7 g/dL (ref 32.0–36.0)
MCV: 83.7 fL (ref 80.0–100.0)
MONO ABS: 0.3 10*3/uL (ref 0.2–0.9)
MONOS PCT: 9 %
Neutro Abs: 2.2 10*3/uL (ref 1.4–6.5)
Neutrophils Relative %: 68 %
PLATELETS: 112 10*3/uL — AB (ref 150–440)
RBC: 3.89 MIL/uL (ref 3.80–5.20)
RDW: 13.9 % (ref 11.5–14.5)
WBC: 3.2 10*3/uL — ABNORMAL LOW (ref 3.6–11.0)

## 2016-05-14 LAB — INFLUENZA PANEL BY PCR (TYPE A & B)
INFLAPCR: NEGATIVE
INFLBPCR: NEGATIVE

## 2016-05-14 LAB — SEDIMENTATION RATE: SED RATE: 35 mm/h — AB (ref 0–30)

## 2016-05-14 MED ORDER — IOPAMIDOL (ISOVUE-300) INJECTION 61%
15.0000 mL | INTRAVENOUS | Status: AC
Start: 2016-05-14 — End: 2016-05-14
  Administered 2016-05-14: 15 mL via ORAL

## 2016-05-14 MED ORDER — IOPAMIDOL (ISOVUE-300) INJECTION 61%
100.0000 mL | Freq: Once | INTRAVENOUS | Status: AC | PRN
  Administered 2016-05-14: 18:00:00 100 mL via INTRAVENOUS

## 2016-05-14 NOTE — Progress Notes (Signed)
[facility] at [place] NAME: Donna Nielsen    MR#:  [id number]  DATE OF BIRTH:  1940/11/14  SUBJECTIVE:  CHIEF COMPLAINT:   Chief Complaint  Patient presents with  . Fall   -No complaints today. Very confused and oriented to self. -Continues to spike fevers.  REVIEW OF SYSTEMS:  Review of Systems  Constitutional: Positive for chills and fever. Negative for malaise/fatigue.  HENT: Negative for congestion, ear discharge and hearing loss.   Eyes: Negative for blurred vision.  Respiratory: Negative for cough, shortness of breath and wheezing.   Cardiovascular: Negative for chest pain, palpitations and leg swelling.  Gastrointestinal: Negative for abdominal pain, constipation, diarrhea, nausea and vomiting.  Genitourinary: Negative for dysuria.  Musculoskeletal: Negative for myalgias.  Neurological: Negative for dizziness, sensory change, speech change, focal weakness, seizures and headaches.  Psychiatric/Behavioral: Negative for depression.    DRUG ALLERGIES:  No Known Allergies  VITALS:  Blood pressure (!) 128/50, pulse 77, temperature 98.9 F (37.2 C), temperature source Oral, resp. rate (!) 23, height 5\' 4"  (1.626 m), weight 57 kg (125 lb 9.6 oz), SpO2 96 %.  PHYSICAL EXAMINATION:  Physical Exam  GENERAL:  76 y.o.-year-old patient lying in the bed with no acute distress.  EYES: Pupils equal, round, reactive to light and accommodation. No scleral icterus. Extraocular muscles intact.  HEENT: Head atraumatic, normocephalic. Oropharynx and nasopharynx clear. Bruising around her eyes Ears look clean. NECK:  Supple, no jugular venous distention. No thyroid enlargement, no tenderness.  LUNGS: Normal breath sounds bilaterally, no wheezing, rales,rhonchi or crepitation. No use of accessory muscles of respiration.  CARDIOVASCULAR: S1, S2 normal. No rubs, or gallops. 2/6 systolic murmur present. ABDOMEN: Soft, nontender, nondistended. Bowel  sounds present. No organomegaly or mass.  EXTREMITIES: No pedal edema, cyanosis, or clubbing.  NEUROLOGIC: Cranial nerves II through XII are intact. Muscle strength 5/5 in all extremities. Sensation intact. Gait not checked. Following simple commands PSYCHIATRIC: The patient is alert and oriented  to self.  SKIN: No obvious rash, lesion, or ulcer.    LABORATORY PANEL:   CBC  Recent Labs Lab 05/12/16 0450  WBC 3.8  HGB 13.9  HCT 42.0  PLT 134*   ------------------------------------------------------------------------------------------------------------------  Chemistries   Recent Labs Lab 05/11/16 1318  05/13/16 0443  NA 135  < > 136  K 3.3*  < > 3.4*  CL 95*  < > 93*  CO2 31  < > 26  GLUCOSE 172*  < > 160*  BUN 7  < > 10  CREATININE 0.94  < > 0.94  CALCIUM 8.5*  < > 7.8*  AST 17  --   --   ALT 14  --   --   ALKPHOS 70  --   --   BILITOT 0.8  --   --   < > = values in this interval not displayed. ------------------------------------------------------------------------------------------------------------------  Cardiac Enzymes  Recent Labs Lab 05/11/16 1318  TROPONINI <0.03   ------------------------------------------------------------------------------------------------------------------  RADIOLOGY:  No results found.  EKG:   Orders placed or performed during the hospital encounter of 05/11/16  . EKG 12-Lead  . EKG 12-Lead  . ED EKG  . ED EKG    ASSESSMENT AND PLAN:   76 year old female with past medical history significant for Alzheimer's dementia, history of breast cancer, hypothyroidism presents to hospital secondary to an unresponsive episode. Noted to be septic.  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 Continues to spike fevers. But CSF cultures are negative -  Blood cultures are negative. HSV PCR are pending. -No other source of infection identified. Influenza PCR is negative. -Appreciate ID consult. Continue Unasyn and acyclovir. Vancomycin  added again due to ongoing fevers -Pro calcitonin is elevated -Last MAXIMUM TEMPERATURE of 103.1 Fahrenheit early this morning  #2 dementia-alert and active at baseline. Still not back to baseline. Continue Galantamine and Namenda.  #3 glaucoma-continue eyedrops  #4 hypokalemia-replaced  #5 DVT prophylaxis-Lovenox   Physical Therapy consult appreciated. Patient does not need any follow up will discharge -Granddaughter updated   All the records are reviewed and case discussed with Care Management/Social Workerr. Management plans discussed with the patient, family and they are in agreement.  CODE STATUS: DNR  TOTAL TIME TAKING CARE OF THIS PATIENT: 38 minutes.   POSSIBLE D/C IN 2 DAYS, DEPENDING ON CLINICAL CONDITION.   Gladstone Lighter M.D on 05/14/2016 at 9:51 AM  Between 7am to 6pm - Pager - [phone]  After 6pm go to [email or web address] - password [email or web address] [facility]  Office  [phone]  CC: Primary care physician; Rusty Aus, MD

## 2016-05-14 NOTE — Progress Notes (Signed)
[facility] INFECTIOUS DISEASE PROGRESS NOTE Date of Admission:  05/11/2016     ID: SANDAR KRINKE is a 76 y.o. female with fever Active Problems:   Meningitis   Malnutrition of moderate degree   Subjective: Seen with her granddaughter at bedside. She is sleeping most of the day but wakes up to voice. Seems to have difficulty hearing per daughter. Has had recurrent high fevers.  Pt denies pain, denies HA, ST, neck pain, cough, abd pain.  GD reports large amt of stool from colostomy yest and it was looser than usual  ROS  Eleven systems are reviewed and negative except per hpi  Medications:  Antibiotics Given (last 72 hours)    Date/Time Action Medication Dose Rate   05/12/16 0011 Given   ampicillin (OMNIPEN) 2 g in sodium chloride 0.9 % 50 mL IVPB 2 g 150 mL/hr   05/12/16 0149 Given   acyclovir (ZOVIRAX) 545 mg in dextrose 5 % 100 mL IVPB 545 mg 110.9 mL/hr   05/12/16 0335 Given   cefTRIAXone (ROCEPHIN) 2 g in dextrose 5 % 50 mL IVPB 2 g 100 mL/hr   05/12/16 0531 Given   ampicillin (OMNIPEN) 2 g in sodium chloride 0.9 % 50 mL IVPB 2 g 150 mL/hr   05/12/16 1206 Given   ampicillin (OMNIPEN) 2 g in sodium chloride 0.9 % 50 mL IVPB 2 g 150 mL/hr   05/12/16 1500 Given  [awaiting med from pharmacy]   acyclovir (ZOVIRAX) 545 mg in dextrose 5 % 100 mL IVPB 545 mg 110.9 mL/hr   05/12/16 1815 Given   Ampicillin-Sulbactam (UNASYN) 3 g in sodium chloride 0.9 % 100 mL IVPB 3 g 200 mL/hr   05/12/16 2343 Given   Ampicillin-Sulbactam (UNASYN) 3 g in sodium chloride 0.9 % 100 mL IVPB 3 g 200 mL/hr   05/13/16 0220 Given   acyclovir (ZOVIRAX) 545 mg in dextrose 5 % 100 mL IVPB 545 mg 110.9 mL/hr   05/13/16 0604 Given   Ampicillin-Sulbactam (UNASYN) 3 g in sodium chloride 0.9 % 100 mL IVPB 3 g 200 mL/hr   05/13/16 1227 Given   Ampicillin-Sulbactam (UNASYN) 3 g in sodium chloride 0.9 % 100 mL IVPB 3 g 200 mL/hr   05/13/16 1514 Given  [loss of IV access]   acyclovir (ZOVIRAX) 545 mg in  dextrose 5 % 100 mL IVPB 545 mg 110.9 mL/hr   05/13/16 1647 Given   vancomycin (VANCOCIN) IVPB 750 mg/150 ml premix 750 mg 150 mL/hr   05/13/16 1816 Given   Ampicillin-Sulbactam (UNASYN) 3 g in sodium chloride 0.9 % 100 mL IVPB 3 g 200 mL/hr   05/14/16 0056 Given   Ampicillin-Sulbactam (UNASYN) 3 g in sodium chloride 0.9 % 100 mL IVPB 3 g 200 mL/hr   05/14/16 0236 Given   acyclovir (ZOVIRAX) 545 mg in dextrose 5 % 100 mL IVPB 545 mg 110.9 mL/hr   05/14/16 0542 Given   Ampicillin-Sulbactam (UNASYN) 3 g in sodium chloride 0.9 % 100 mL IVPB 3 g 200 mL/hr   05/14/16 1158 Given   Ampicillin-Sulbactam (UNASYN) 3 g in sodium chloride 0.9 % 100 mL IVPB 3 g 200 mL/hr     . ampicillin-sulbactam (UNASYN) IV  3 g Intravenous Q6H  . enoxaparin (LOVENOX) injection  40 mg Subcutaneous Q24H  . feeding supplement (ENSURE ENLIVE)  237 mL Oral TID BM  . fluticasone  2 spray Each Nare Daily  . galantamine  8 mg Oral BID  . latanoprost  1 drop Both Eyes  QHS  . memantine  5 mg Oral BID  . timolol  1 drop Both Eyes Daily  . vancomycin  750 mg Intravenous Q24H  . vitamin B-12  1,000 mcg Oral QODAY    Objective: Vital signs in last 24 hours: Temp:  [98.3 F (36.8 C)-103.1 F (39.5 C)] 99.4 F (37.4 C) (03/09 1317) Pulse Rate:  [77-94] 88 (03/09 1317) Resp:  [20-23] 20 (03/09 0749) BP: (128-146)/(50-55) 144/53 (03/09 1317) SpO2:  [92 %-100 %] 100 % (03/09 1317) Weight:  [57 kg (125 lb 9.6 oz)] 57 kg (125 lb 9.6 oz) (03/09 0500) Physical Exam  Constitutional: frail, lying in bed, able to be aroused and then is interactive HENT:  Bruising around face. No tenderness PERRLA, no scleral icterus  Mouth/Throat: Oropharynx is clear and moist. No oropharyngeal exudate.  Cardiovascular: Normal rate, regular rhythm and normal heart sounds. 1/6 sm Pulmonary/Chest: Effort normal and breath sounds normal. No respiratory distress.  has no wheezes.  Neck = supple, no nuchal rigidity Abdominal: Soft. Bowel sounds  are normal. Colostomy site wnl exhibits no distension. There is no obvious tenderness.  Lymphadenopathy: no cervical adenopathy. No axillary adenopathy Neurological: sleepy but arousable, moves all 4 Skin: Skin is warm and dry. No rash noted. No erythema.  Psychiatric: a normal mood and affect.  behavior is normal.   Lab Results  Recent Labs  05/12/16 0450 05/13/16 0443  WBC 3.8  --   HGB 13.9  --   HCT 42.0  --   NA 136 136  K 3.5 3.4*  CL 97* 93*  CO2 29 26  BUN 9 10  CREATININE 0.70 0.94    Microbiology: Results for orders placed or performed during the hospital encounter of 05/11/16  CSF culture     Status: None (Preliminary result)   Collection Time: 05/11/16  6:12 PM  Result Value Ref Range Status   Specimen Description CSF  Final   Special Requests NONE  Final   Gram Stain NO ORGANISMS SEEN FEW WBC   Final   Culture   Final    NO GROWTH 3 DAYS Performed at [facility], [place] [address] [place], [place] [address]    Report Status PENDING  Incomplete  CULTURE, BLOOD (ROUTINE X 2) w Reflex to ID Panel     Status: None (Preliminary result)   Collection Time: 05/12/16  6:55 PM  Result Value Ref Range Status   Specimen Description BLOOD RIGHT AC  Final   Special Requests BOTTLES DRAWN AEROBIC AND ANAEROBIC BCAV  Final   Culture NO GROWTH 2 DAYS  Final   Report Status PENDING  Incomplete  CULTURE, BLOOD (ROUTINE X 2) w Reflex to ID Panel     Status: None (Preliminary result)   Collection Time: 05/12/16  6:55 PM  Result Value Ref Range Status   Specimen Description BLOOD RIGHT HAND  Final   Special Requests BOTTLES DRAWN AEROBIC AND ANAEROBIC BCAV  Final   Culture NO GROWTH 2 DAYS  Final   Report Status PENDING  Incomplete     Studies/Results: No results found.  Assessment/Plan: RAYMONA BOSS is a 76 y.o. female with Alzheimers, cared for at home by family admitted with AMS and a fever to 102. She had a fall a few days prior to [facility] with facial  contusion and nasal bone fx. Since admit CXR neg, CT head neg, ua neg, flu pcr neg. LP done but CSF is not very impressive with a mild lymphocytic predomniance  pleocytosis, mild elevation protein. CSF HSV PCR and culture negative.  She has been on unasyn acyclovir and vanco (got 2 doses of ceftriaxone as well) I do not think she has a bacterial meningitis however facial infection from her fx is possible.  No evidence UTI or PNA but could have aspiration when altered. She has a colostomy and granddaughter reports increased outpt and also recurrent hiccups.  March 9th -currently rigoring and temp 102.  Recommendations Continue vanco and zosyn Check CT chest abd pelvis Repeat bcx, cbc, esr If no obvious cause consider MRI brain and ENT consult to eval for sinus infection  Thank you very much for the consult. Will follow with you.  Iretha Kirley P   05/14/2016, 2:32 PM

## 2016-05-15 ENCOUNTER — Inpatient Hospital Stay: Payer: Medicare Other

## 2016-05-15 LAB — BASIC METABOLIC PANEL
ANION GAP: 7 (ref 5–15)
BUN: <5 mg/dL — ABNORMAL LOW (ref 6–20)
CALCIUM: 7.6 mg/dL — AB (ref 8.9–10.3)
CHLORIDE: 95 mmol/L — AB (ref 101–111)
CO2: 30 mmol/L (ref 22–32)
Creatinine, Ser: 0.69 mg/dL (ref 0.44–1.00)
GFR calc Af Amer: >60 mL/min (ref >60)
GFR calc non Af Amer: >60 mL/min (ref >60)
GLUCOSE: 151 mg/dL — AB (ref 65–99)
Potassium: 3.8 mmol/L (ref 3.5–5.1)
Sodium: 132 mmol/L — ABNORMAL LOW (ref 135–145)

## 2016-05-15 LAB — CBC
HCT: 33.1 % — ABNORMAL LOW (ref 35.0–47.0)
HEMOGLOBIN: 11.5 g/dL — AB (ref 12.0–16.0)
MCH: 29.1 pg (ref 26.0–34.0)
MCHC: 34.7 g/dL (ref 32.0–36.0)
MCV: 83.8 fL (ref 80.0–100.0)
Platelets: 119 10*3/uL — ABNORMAL LOW (ref 150–440)
RBC: 3.95 MIL/uL (ref 3.80–5.20)
RDW: 13.7 % (ref 11.5–14.5)
WBC: 4.4 10*3/uL (ref 3.6–11.0)

## 2016-05-15 LAB — CSF CULTURE W GRAM STAIN
Culture: NO GROWTH
Gram Stain: NONE SEEN

## 2016-05-15 LAB — CSF CULTURE

## 2016-05-15 LAB — PROCALCITONIN: Procalcitonin: 0.18 ng/mL

## 2016-05-15 MED ORDER — GADOBENATE DIMEGLUMINE 529 MG/ML IV SOLN
10.0000 mL | Freq: Once | INTRAVENOUS | Status: AC | PRN
  Administered 2016-05-15: 10 mL via INTRAVENOUS

## 2016-05-15 NOTE — Progress Notes (Signed)
[facility] [place] NAME: Donna Nielsen    MR#:  [id number]  DATE OF BIRTH:  [birth year]  SUBJECTIVE:  CHIEF COMPLAINT:   Chief Complaint  Patient presents with  . Fall   -discussed with daughter at bedside, patient is more alert but confused and hearing is worsened  - continues to spike fevers- Tmax of 103F early morning today at 5AM  REVIEW OF SYSTEMS:  Review of Systems  Constitutional: Positive for chills and fever. Negative for malaise/fatigue.  HENT: Negative for congestion, ear discharge and hearing loss.   Eyes: Negative for blurred vision.  Respiratory: Negative for cough, shortness of breath and wheezing.   Cardiovascular: Negative for chest pain, palpitations and leg swelling.  Gastrointestinal: Negative for abdominal pain, constipation, diarrhea, nausea and vomiting.  Genitourinary: Negative for dysuria.  Musculoskeletal: Negative for myalgias.  Neurological: Negative for dizziness, sensory change, speech change, focal weakness, seizures and headaches.  Psychiatric/Behavioral: Negative for depression.       Confusion    DRUG ALLERGIES:  No Known Allergies  VITALS:  Blood pressure (!) 129/42, pulse 89, temperature (!) 102.3 F (39.1 C), temperature source Oral, resp. rate 20, height 5\' 4"  (1.626 m), weight 54.6 kg (120 lb 6.4 oz), SpO2 96 %.  PHYSICAL EXAMINATION:  Physical Exam  GENERAL:  76 y.o.-year-old patient lying in the bed with no acute distress. Slightly heard of hearing EYES: Pupils equal, round, reactive to light and accommodation. No scleral icterus. Extraocular muscles intact.  HEENT: Head atraumatic, normocephalic. Oropharynx and nasopharynx clear. Bruising around her eyes Ears look clean. Some fullness in right middle ear NECK:  Supple, no jugular venous distention. No thyroid enlargement, no tenderness.  LUNGS: Normal breath sounds bilaterally, no wheezing, rales,rhonchi or crepitation. No use of  accessory muscles of respiration.  CARDIOVASCULAR: S1, S2 normal. No rubs, or gallops. 2/6 systolic murmur present. ABDOMEN: Soft, nontender, nondistended. Bowel sounds present. No organomegaly or mass.  EXTREMITIES: No pedal edema, cyanosis, or clubbing.  NEUROLOGIC: Cranial nerves II through XII are intact. Muscle strength 5/5 in all extremities. Sensation intact. Gait not checked. Following simple commands PSYCHIATRIC: The patient is alert and oriented  to self.  SKIN: No obvious rash, lesion, or ulcer.    LABORATORY PANEL:   CBC  Recent Labs Lab 05/15/16 0447  WBC 4.4  HGB 11.5*  HCT 33.1*  PLT 119*   ------------------------------------------------------------------------------------------------------------------  Chemistries   Recent Labs Lab 05/11/16 1318  05/15/16 0447  NA 135  < > 132*  K 3.3*  < > 3.8  CL 95*  < > 95*  CO2 31  < > 30  GLUCOSE 172*  < > 151*  BUN 7  < > <5*  CREATININE 0.94  < > 0.69  CALCIUM 8.5*  < > 7.6*  AST 17  --   --   ALT 14  --   --   ALKPHOS 70  --   --   BILITOT 0.8  --   --   < > = values in this interval not displayed. ------------------------------------------------------------------------------------------------------------------  Cardiac Enzymes  Recent Labs Lab 05/11/16 1318  TROPONINI <0.03   ------------------------------------------------------------------------------------------------------------------  RADIOLOGY:  Ct Chest W Contrast  Result Date: 05/14/2016 CLINICAL DATA:  Fever. History of breast cancer and cervical cancer. EXAM: CT CHEST, ABDOMEN, AND PELVIS WITH CONTRAST TECHNIQUE: Multidetector CT imaging of the chest, abdomen and pelvis was performed following the standard protocol during bolus administration of intravenous contrast. CONTRAST:  18mL ISOVUE-300 IOPAMIDOL (ISOVUE-300) INJECTION 61% COMPARISON:  None. FINDINGS: CT CHEST FINDINGS Cardiovascular: Heart size mildly enlarged. Coronary artery  calcification is noted. Atherosclerotic calcification is noted in the wall of the thoracic aorta. Mediastinum/Nodes: Upper normal lymph nodes are seen in the mediastinum including a calcified AP window lymph node. Mild wall thickening noted distal esophagus. Upper normal right axillary lymph nodes. Previous right mastectomy. Lungs/Pleura: Breathing motion hinders assessment of fine architectural detail the lungs. Within this limitation, no suspicious pulmonary nodule or mass. There is bilateral dependent minimal collapse/consolidation. Tiny bilateral pleural effusions are associated. Musculoskeletal: Bone windows reveal no worrisome lytic or sclerotic osseous lesions. CT ABDOMEN PELVIS FINDINGS Hepatobiliary: No focal abnormality within the liver parenchyma. Gallbladder is decompressed. No intrahepatic or extrahepatic biliary dilation. Pancreas: No focal mass lesion. No dilatation of the main duct. No intraparenchymal cyst. No peripancreatic edema. Spleen: No splenomegaly. No focal mass lesion. Adrenals/Urinary Tract: No adrenal nodule or mass. Tiny hypodense lesions in the cortex of each kidney are too small to characterize but likely represent cysts. No evidence for hydroureter. The urinary bladder appears normal for the degree of distention. Stomach/Bowel: Small hiatal hernia noted. Stomach is nondistended. No gastric wall thickening. No evidence of outlet obstruction. Duodenum is normally positioned as is the ligament of Treitz. Fold prominence noted in jejunal loops without evidence dilatation. No dilation of distal small bowel. Terminal ileum is normal. Appendix is normal. Left lower quadrant sigmoid end colostomy noted. Vascular/Lymphatic: There is abdominal aortic atherosclerosis without aneurysm. Note gastrohepatic or hepatoduodenal ligament lymphadenopathy. No retroperitoneal lymphadenopathy. Borderline lymphadenopathy is identified in the central small bowel mesentery which has associated nonspecific  haziness. No pelvic sidewall lymphadenopathy. Reproductive: Soft tissue attenuation in the presacral space is identified, but is largely obscured by surgical clips in the same region. It is unclear whether this represents posterior displacement of the uterus by a moderately distended urinary bladder or could be secondary to rectal stump as a Hartmann's pouch is not evident. Other: No intraperitoneal free fluid. Musculoskeletal: Bone windows reveal no worrisome lytic or sclerotic osseous lesions. IMPRESSION: 1. No acute findings in the chest with borderline mediastinal and right axillary lymphadenopathy. 2. Soft tissue attenuation in the presacral space may be related to the uterus although it cannot be clearly evaluated given the substantial streak artifact arising from surgical clips in the same region. This may also reflect rectal tissue or postoperative granulation. Given the history of cervical cancer, recurrent disease cannot be excluded. Correlation to previous imaging studies would be helpful to determine the chronicity/stability of this finding. 3. Borderline lymphadenopathy and haziness in the root of the small bowel mesenteric, mainly in the jejunal distribution. This is a nonspecific finding and may be chronic although a degree of enteritis cannot be excluded on this particular study. 4.  Abdominal Aortic Atherosclerois (ICD10-170.0) Electronically Signed   By: Misty Stanley M.D.   On: 05/14/2016 19:48   Ct Abdomen Pelvis W Contrast  Result Date: 05/14/2016 CLINICAL DATA:  Fever. History of breast cancer and cervical cancer. EXAM: CT CHEST, ABDOMEN, AND PELVIS WITH CONTRAST TECHNIQUE: Multidetector CT imaging of the chest, abdomen and pelvis was performed following the standard protocol during bolus administration of intravenous contrast. CONTRAST:  150mL ISOVUE-300 IOPAMIDOL (ISOVUE-300) INJECTION 61% COMPARISON:  None. FINDINGS: CT CHEST FINDINGS Cardiovascular: Heart size mildly enlarged. Coronary  artery calcification is noted. Atherosclerotic calcification is noted in the wall of the thoracic aorta. Mediastinum/Nodes: Upper normal lymph nodes are seen in the mediastinum including a calcified AP  window lymph node. Mild wall thickening noted distal esophagus. Upper normal right axillary lymph nodes. Previous right mastectomy. Lungs/Pleura: Breathing motion hinders assessment of fine architectural detail the lungs. Within this limitation, no suspicious pulmonary nodule or mass. There is bilateral dependent minimal collapse/consolidation. Tiny bilateral pleural effusions are associated. Musculoskeletal: Bone windows reveal no worrisome lytic or sclerotic osseous lesions. CT ABDOMEN PELVIS FINDINGS Hepatobiliary: No focal abnormality within the liver parenchyma. Gallbladder is decompressed. No intrahepatic or extrahepatic biliary dilation. Pancreas: No focal mass lesion. No dilatation of the main duct. No intraparenchymal cyst. No peripancreatic edema. Spleen: No splenomegaly. No focal mass lesion. Adrenals/Urinary Tract: No adrenal nodule or mass. Tiny hypodense lesions in the cortex of each kidney are too small to characterize but likely represent cysts. No evidence for hydroureter. The urinary bladder appears normal for the degree of distention. Stomach/Bowel: Small hiatal hernia noted. Stomach is nondistended. No gastric wall thickening. No evidence of outlet obstruction. Duodenum is normally positioned as is the ligament of Treitz. Fold prominence noted in jejunal loops without evidence dilatation. No dilation of distal small bowel. Terminal ileum is normal. Appendix is normal. Left lower quadrant sigmoid end colostomy noted. Vascular/Lymphatic: There is abdominal aortic atherosclerosis without aneurysm. Note gastrohepatic or hepatoduodenal ligament lymphadenopathy. No retroperitoneal lymphadenopathy. Borderline lymphadenopathy is identified in the central small bowel mesentery which has associated  nonspecific haziness. No pelvic sidewall lymphadenopathy. Reproductive: Soft tissue attenuation in the presacral space is identified, but is largely obscured by surgical clips in the same region. It is unclear whether this represents posterior displacement of the uterus by a moderately distended urinary bladder or could be secondary to rectal stump as a Hartmann's pouch is not evident. Other: No intraperitoneal free fluid. Musculoskeletal: Bone windows reveal no worrisome lytic or sclerotic osseous lesions. IMPRESSION: 1. No acute findings in the chest with borderline mediastinal and right axillary lymphadenopathy. 2. Soft tissue attenuation in the presacral space may be related to the uterus although it cannot be clearly evaluated given the substantial streak artifact arising from surgical clips in the same region. This may also reflect rectal tissue or postoperative granulation. Given the history of cervical cancer, recurrent disease cannot be excluded. Correlation to previous imaging studies would be helpful to determine the chronicity/stability of this finding. 3. Borderline lymphadenopathy and haziness in the root of the small bowel mesenteric, mainly in the jejunal distribution. This is a nonspecific finding and may be chronic although a degree of enteritis cannot be excluded on this particular study. 4.  Abdominal Aortic Atherosclerois (ICD10-170.0) Electronically Signed   By: Misty Stanley M.D.   On: 05/14/2016 19:48    EKG:   Orders placed or performed during the hospital encounter of 05/11/16  . EKG 12-Lead  . EKG 12-Lead  . ED EKG  . ED EKG    ASSESSMENT AND PLAN:   76 year old female with past medical history significant for Alzheimer's dementia, history of breast cancer, hypothyroidism presents to hospital secondary to an unresponsive episode. Noted to be septic.  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 Continues to spike fevers. But CSF cultures are negative -Blood cultures are  negative. HSV PCR is negative. -No other source of infection identified. Influenza PCR is negative. -Appreciate ID consult.  -Continue Unasyn and Vancomycin, acyclovir discontinued as HSV PCR is negative -Pro calcitonin is elevated - CT of the abdomen and pelvis showing mild enteritis changes. No further diarrhea is noted. Some confusion still present. -Per ID recommendation, we'll get an MRI of the brain. Questionable sinus  infection from the fall cannot be ruled out. White count is still within normal limits  #2 dementia-alert and active at baseline. Remains confused. Continue Galantamine and Namenda. MRI of the brain today  #3 glaucoma-continue eyedrops  #4 hypokalemia-replaced  #5 DVT prophylaxis-Lovenox   Physical Therapy consult appreciated. Patient does not need any follow up will discharge -Daughter updated   All the records are reviewed and case discussed with Care Management/Social Workerr. Management plans discussed with the patient, family and they are in agreement.  CODE STATUS: DNR  TOTAL TIME TAKING CARE OF THIS PATIENT: 35 minutes.   POSSIBLE D/C IN 1-2 DAYS, DEPENDING ON CLINICAL CONDITION.   Trellis Guirguis M.D on 05/15/2016 at 12:31 PM  Between 7am to 6pm - Pager - [phone]  After 6pm go to [email or web address] - password [email or web address]  Office  [phone]  CC: Primary care physician; Rusty Aus, MD

## 2016-05-16 LAB — VANCOMYCIN, TROUGH: Vancomycin Tr: 7 ug/mL — ABNORMAL LOW (ref 15–20)

## 2016-05-16 LAB — BASIC METABOLIC PANEL
Anion gap: 5 (ref 5–15)
BUN: 7 mg/dL (ref 6–20)
CALCIUM: 7.7 mg/dL — AB (ref 8.9–10.3)
CO2: 29 mmol/L (ref 22–32)
Chloride: 100 mmol/L — ABNORMAL LOW (ref 101–111)
Creatinine, Ser: 0.77 mg/dL (ref 0.44–1.00)
GFR calc Af Amer: >60 mL/min (ref >60)
GLUCOSE: 174 mg/dL — AB (ref 65–99)
Potassium: 4.1 mmol/L (ref 3.5–5.1)
Sodium: 134 mmol/L — ABNORMAL LOW (ref 135–145)

## 2016-05-16 MED ORDER — VANCOMYCIN HCL IN DEXTROSE 1-5 GM/200ML-% IV SOLN
1000.0000 mg | INTRAVENOUS | Status: DC
  Administered 2016-05-17: 1000 mg via INTRAVENOUS
  Filled 2016-05-16 (×2): qty 200

## 2016-05-16 NOTE — Progress Notes (Addendum)
[facility] NAME: Donna Nielsen    MR#:  [id number]  DATE OF BIRTH:  Oct 24, 1940  SUBJECTIVE:  CHIEF COMPLAINT:   Chief Complaint  Patient presents with  . Fall   -Fevers are better today, T max of 100.4F only in the last 24hrs - mentally more alert, ambulating well - family at bedside  REVIEW OF SYSTEMS:  Review of Systems  Constitutional: Positive for fever. Negative for chills and malaise/fatigue.  HENT: Negative for congestion, ear discharge and hearing loss.   Eyes: Negative for blurred vision.  Respiratory: Negative for cough, shortness of breath and wheezing.   Cardiovascular: Negative for chest pain, palpitations and leg swelling.  Gastrointestinal: Negative for abdominal pain, constipation, diarrhea, nausea and vomiting.  Genitourinary: Negative for dysuria.  Musculoskeletal: Negative for myalgias.  Neurological: Negative for dizziness, sensory change, speech change, focal weakness, seizures and headaches.  Psychiatric/Behavioral: Negative for depression.       Confusion but improving    DRUG ALLERGIES:  No Known Allergies  VITALS:  Blood pressure (!) 124/57, pulse 72, temperature 97.7 F (36.5 C), temperature source Oral, resp. rate 20, height 5\' 4"  (1.626 m), weight 58.4 kg (128 lb 11.2 oz), SpO2 100 %.  PHYSICAL EXAMINATION:  Physical Exam  GENERAL:  76 y.o.-year-old patient lying in the bed with no acute distress. Slightly hard of hearing EYES: Pupils equal, round, reactive to light and accommodation. No scleral icterus. Extraocular muscles intact.  HEENT: Head atraumatic, normocephalic. Oropharynx and nasopharynx clear. Bruising around her eyes Ears look clean. Some fullness in right middle ear NECK:  Supple, no jugular venous distention. No thyroid enlargement, no tenderness.  LUNGS: Normal breath sounds bilaterally, no wheezing, rales,rhonchi or crepitation. No use of accessory muscles of respiration.    CARDIOVASCULAR: S1, S2 normal. No rubs, or gallops. 2/6 systolic murmur present. ABDOMEN: Soft, nontender, nondistended. Colostomy bag with stool in place. Bowel sounds present. No organomegaly or mass.  EXTREMITIES: No pedal edema, cyanosis, or clubbing.  NEUROLOGIC: Cranial nerves II through XII are intact. Muscle strength 5/5 in all extremities. Sensation intact. Gait not checked.  PSYCHIATRIC: The patient is alert and oriented x 1-2 SKIN: No obvious rash, lesion, or ulcer.    LABORATORY PANEL:   CBC  Recent Labs Lab 05/15/16 0447  WBC 4.4  HGB 11.5*  HCT 33.1*  PLT 119*   ------------------------------------------------------------------------------------------------------------------  Chemistries   Recent Labs Lab 05/11/16 1318  05/16/16 0522  NA 135  < > 134*  K 3.3*  < > 4.1  CL 95*  < > 100*  CO2 31  < > 29  GLUCOSE 172*  < > 174*  BUN 7  < > 7  CREATININE 0.94  < > 0.77  CALCIUM 8.5*  < > 7.7*  AST 17  --   --   ALT 14  --   --   ALKPHOS 70  --   --   BILITOT 0.8  --   --   < > = values in this interval not displayed. ------------------------------------------------------------------------------------------------------------------  Cardiac Enzymes  Recent Labs Lab 05/11/16 1318  TROPONINI <0.03   ------------------------------------------------------------------------------------------------------------------  RADIOLOGY:  Ct Chest W Contrast  Result Date: 05/14/2016 CLINICAL DATA:  Fever. History of breast cancer and cervical cancer. EXAM: CT CHEST, ABDOMEN, AND PELVIS WITH CONTRAST TECHNIQUE: Multidetector CT imaging of the chest, abdomen and pelvis was performed following the standard protocol during bolus administration of intravenous contrast. CONTRAST:  136mL ISOVUE-300  IOPAMIDOL (ISOVUE-300) INJECTION 61% COMPARISON:  None. FINDINGS: CT CHEST FINDINGS Cardiovascular: Heart size mildly enlarged. Coronary artery calcification is noted.  Atherosclerotic calcification is noted in the wall of the thoracic aorta. Mediastinum/Nodes: Upper normal lymph nodes are seen in the mediastinum including a calcified AP window lymph node. Mild wall thickening noted distal esophagus. Upper normal right axillary lymph nodes. Previous right mastectomy. Lungs/Pleura: Breathing motion hinders assessment of fine architectural detail the lungs. Within this limitation, no suspicious pulmonary nodule or mass. There is bilateral dependent minimal collapse/consolidation. Tiny bilateral pleural effusions are associated. Musculoskeletal: Bone windows reveal no worrisome lytic or sclerotic osseous lesions. CT ABDOMEN PELVIS FINDINGS Hepatobiliary: No focal abnormality within the liver parenchyma. Gallbladder is decompressed. No intrahepatic or extrahepatic biliary dilation. Pancreas: No focal mass lesion. No dilatation of the main duct. No intraparenchymal cyst. No peripancreatic edema. Spleen: No splenomegaly. No focal mass lesion. Adrenals/Urinary Tract: No adrenal nodule or mass. Tiny hypodense lesions in the cortex of each kidney are too small to characterize but likely represent cysts. No evidence for hydroureter. The urinary bladder appears normal for the degree of distention. Stomach/Bowel: Small hiatal hernia noted. Stomach is nondistended. No gastric wall thickening. No evidence of outlet obstruction. Duodenum is normally positioned as is the ligament of Treitz. Fold prominence noted in jejunal loops without evidence dilatation. No dilation of distal small bowel. Terminal ileum is normal. Appendix is normal. Left lower quadrant sigmoid end colostomy noted. Vascular/Lymphatic: There is abdominal aortic atherosclerosis without aneurysm. Note gastrohepatic or hepatoduodenal ligament lymphadenopathy. No retroperitoneal lymphadenopathy. Borderline lymphadenopathy is identified in the central small bowel mesentery which has associated nonspecific haziness. No pelvic sidewall  lymphadenopathy. Reproductive: Soft tissue attenuation in the presacral space is identified, but is largely obscured by surgical clips in the same region. It is unclear whether this represents posterior displacement of the uterus by a moderately distended urinary bladder or could be secondary to rectal stump as a Hartmann's pouch is not evident. Other: No intraperitoneal free fluid. Musculoskeletal: Bone windows reveal no worrisome lytic or sclerotic osseous lesions. IMPRESSION: 1. No acute findings in the chest with borderline mediastinal and right axillary lymphadenopathy. 2. Soft tissue attenuation in the presacral space may be related to the uterus although it cannot be clearly evaluated given the substantial streak artifact arising from surgical clips in the same region. This may also reflect rectal tissue or postoperative granulation. Given the history of cervical cancer, recurrent disease cannot be excluded. Correlation to previous imaging studies would be helpful to determine the chronicity/stability of this finding. 3. Borderline lymphadenopathy and haziness in the root of the small bowel mesenteric, mainly in the jejunal distribution. This is a nonspecific finding and may be chronic although a degree of enteritis cannot be excluded on this particular study. 4.  Abdominal Aortic Atherosclerois (ICD10-170.0) Electronically Signed   By: Misty Stanley M.D.   On: 05/14/2016 19:48   Mr Jeri Cos ZO Contrast  Result Date: 05/15/2016 CLINICAL DATA:  Progressive confusion. Altered mental status. Trauma 5 days ago. Fever. EXAM: MRI HEAD WITHOUT AND WITH CONTRAST TECHNIQUE: Multiplanar, multiecho pulse sequences of the brain and surrounding structures were obtained without and with intravenous contrast. CONTRAST:  48mL MULTIHANCE GADOBENATE DIMEGLUMINE 529 MG/ML IV SOLN COMPARISON:  None. FINDINGS: Brain: Mild atrophy and white matter changes are within normal limits for age. There is mild diffuse dural thickening.  No parenchymal enhancement is present. No significant white matter disease is present. The brainstem and cerebellum are normal. Ventricles are proportionate to  the degree of atrophy. No significant extra-axial fluid collection is present. Vascular: Flow is present in the major intracranial arteries. Skull and upper cervical spine: Skullbase is within normal limits. The craniocervical junction is normal. The upper cervical spine is unremarkable. Midline sagittal structures are within normal limits. The left supraorbital scalp hematoma is again seen. Sinuses/Orbits: Mild mucosal thickening is present throughout the ethmoid air cells bilaterally and inferior left frontal sinus. Bilateral mastoid effusions are present. No obstructing nasopharyngeal lesion is evident. The patient is status post bilateral lens replacements. The globes and orbits are within normal limits otherwise. IMPRESSION: 1. Mild diffuse dural enhancement. This is likely secondary to recent lumbar puncture. No discrete parenchymal enhancement or extra-axial fluid collection is present. 2. Mild atrophy and white matter disease is within normal limits for age. 3. Mild diffuse ethmoid mucosal thickening without fluid levels. 4. Bilateral mastoid effusions without definite enhancement. No obstructing nasopharyngeal lesion is present. Electronically Signed   By: San Morelle M.D.   On: 05/15/2016 16:26   Ct Abdomen Pelvis W Contrast  Result Date: 05/14/2016 CLINICAL DATA:  Fever. History of breast cancer and cervical cancer. EXAM: CT CHEST, ABDOMEN, AND PELVIS WITH CONTRAST TECHNIQUE: Multidetector CT imaging of the chest, abdomen and pelvis was performed following the standard protocol during bolus administration of intravenous contrast. CONTRAST:  158mL ISOVUE-300 IOPAMIDOL (ISOVUE-300) INJECTION 61% COMPARISON:  None. FINDINGS: CT CHEST FINDINGS Cardiovascular: Heart size mildly enlarged. Coronary artery calcification is noted.  Atherosclerotic calcification is noted in the wall of the thoracic aorta. Mediastinum/Nodes: Upper normal lymph nodes are seen in the mediastinum including a calcified AP window lymph node. Mild wall thickening noted distal esophagus. Upper normal right axillary lymph nodes. Previous right mastectomy. Lungs/Pleura: Breathing motion hinders assessment of fine architectural detail the lungs. Within this limitation, no suspicious pulmonary nodule or mass. There is bilateral dependent minimal collapse/consolidation. Tiny bilateral pleural effusions are associated. Musculoskeletal: Bone windows reveal no worrisome lytic or sclerotic osseous lesions. CT ABDOMEN PELVIS FINDINGS Hepatobiliary: No focal abnormality within the liver parenchyma. Gallbladder is decompressed. No intrahepatic or extrahepatic biliary dilation. Pancreas: No focal mass lesion. No dilatation of the main duct. No intraparenchymal cyst. No peripancreatic edema. Spleen: No splenomegaly. No focal mass lesion. Adrenals/Urinary Tract: No adrenal nodule or mass. Tiny hypodense lesions in the cortex of each kidney are too small to characterize but likely represent cysts. No evidence for hydroureter. The urinary bladder appears normal for the degree of distention. Stomach/Bowel: Small hiatal hernia noted. Stomach is nondistended. No gastric wall thickening. No evidence of outlet obstruction. Duodenum is normally positioned as is the ligament of Treitz. Fold prominence noted in jejunal loops without evidence dilatation. No dilation of distal small bowel. Terminal ileum is normal. Appendix is normal. Left lower quadrant sigmoid end colostomy noted. Vascular/Lymphatic: There is abdominal aortic atherosclerosis without aneurysm. Note gastrohepatic or hepatoduodenal ligament lymphadenopathy. No retroperitoneal lymphadenopathy. Borderline lymphadenopathy is identified in the central small bowel mesentery which has associated nonspecific haziness. No pelvic sidewall  lymphadenopathy. Reproductive: Soft tissue attenuation in the presacral space is identified, but is largely obscured by surgical clips in the same region. It is unclear whether this represents posterior displacement of the uterus by a moderately distended urinary bladder or could be secondary to rectal stump as a Hartmann's pouch is not evident. Other: No intraperitoneal free fluid. Musculoskeletal: Bone windows reveal no worrisome lytic or sclerotic osseous lesions. IMPRESSION: 1. No acute findings in the chest with borderline mediastinal and right axillary lymphadenopathy. 2. Soft  tissue attenuation in the presacral space may be related to the uterus although it cannot be clearly evaluated given the substantial streak artifact arising from surgical clips in the same region. This may also reflect rectal tissue or postoperative granulation. Given the history of cervical cancer, recurrent disease cannot be excluded. Correlation to previous imaging studies would be helpful to determine the chronicity/stability of this finding. 3. Borderline lymphadenopathy and haziness in the root of the small bowel mesenteric, mainly in the jejunal distribution. This is a nonspecific finding and may be chronic although a degree of enteritis cannot be excluded on this particular study. 4.  Abdominal Aortic Atherosclerois (ICD10-170.0) Electronically Signed   By: Misty Stanley M.D.   On: 05/14/2016 19:48    EKG:   Orders placed or performed during the hospital encounter of 05/11/16  . EKG 12-Lead  . EKG 12-Lead  . ED EKG  . ED EKG    ASSESSMENT AND PLAN:   76 year old female with past medical history significant for Alzheimer's dementia, history of breast cancer, hypothyroidism presents to hospital secondary to an unresponsive episode. Noted to be septic.  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 Fevers seem to be improving today - But CSF cultures are negative -Blood cultures are negative. HSV PCR is negative. -No  other source of infection identified. Influenza PCR is negative. -Appreciate ID consult.  -on Unasyn and Vancomycin- continue for 1 more day, acyclovir discontinued as HSV PCR is negative -Pro calcitonin is elevated - CT of the abdomen and pelvis showing mild enteritis changes. No further diarrhea is noted.  - MRI of the brain with slight meningeal enhancement from LP, and sinus fullness, no abscess or other source of infection noted.  #2 dementia-alert and active at baseline. Remains confused. Continue Galantamine and Namenda. MRI of the brain with no acute findings  #3 glaucoma-continue eyedrops  #4 hypokalemia-replaced  #5 DVT prophylaxis-Lovenox   Physical Therapy consult appreciated. Patient does not need any follow up will discharge -Daughter and grand daughter updated - discharge tomorrow if remains afebrile   All the records are reviewed and case discussed with Care Management/Social Workerr. Management plans discussed with the patient, family and they are in agreement.  CODE STATUS: DNR  TOTAL TIME TAKING CARE OF THIS PATIENT: 35 minutes.   POSSIBLE D/C TOMORROW,  DEPENDING ON CLINICAL CONDITION.   Gladstone Lighter M.D on 05/16/2016 at 9:34 AM  Between 7am to 6pm - Pager - [phone]  After 6pm go to [email or web address] - password [email or web address]  Office  [phone]  CC: Primary care physician; Rusty Aus, MD

## 2016-05-16 NOTE — Progress Notes (Signed)
Pharmacy Antibiotic Note  Donna Nielsen is a 76 y.o. female admitted on 05/11/2016 with meningitis.  Pharmacy has been consulted to restart Vancomycin dosing. Currently ordered Unasyn 3g IV q6h and Acyclovir.  Plan: Vancomycin level resulted @ 7. Will increase Vancomycin dose to 1 g IV q18 hours. Will recheck trough level prior to the 12:00 dose on 3/14.   Height: 5\' 4"  (162.6 cm) Weight: 128 lb 11.2 oz (58.4 kg) IBW/kg (Calculated) : 54.7  Temp (24hrs), Avg:99.4 F (37.4 C), Min:97.7 F (36.5 C), Max:100.6 F (38.1 C)   Recent Labs Lab 05/11/16 1318 05/12/16 0450 05/13/16 0443 05/14/16 1518 05/15/16 0447 05/16/16 0522 05/16/16 1424  WBC 4.3 3.8  --  3.2* 4.4  --   --   CREATININE 0.94 0.70 0.94  --  0.69 0.77  --   VANCOTROUGH  --   --   --   --   --   --  7*    Estimated Creatinine Clearance: 51.7 mL/min (by C-G formula based on SCr of 0.77 mg/dL).    No Known Allergies  Antimicrobials this admission: Vancomycin 3/6 >> 3/7 >> restarted 3/8 >> Ceftriaxone 3/6 >> 3/7 Ampicillin 3/6 >> 3/7 Acyclovir 3/6 >>  Unasyn 3/6 >>  Microbiology results: 3/6 CSF: 3/6 influenza neg  Thank you for allowing pharmacy to be a part of this patient's care. Larene Beach, PharmD  05/16/2016 3:26 PM

## 2016-05-16 NOTE — Progress Notes (Signed)
precepted SN Wyona Almas this shift; agree with assessments in flowsheets

## 2016-05-16 NOTE — Plan of Care (Signed)
Problem: Safety: Goal: Ability to remain free from injury will improve Outcome: Progressing Pt was able to walk to the bathroom. Walked around the nurses' station with family. Family at bed side most of the day. Still has urgency but knows to wait for assistance to go to the bathroom.

## 2016-05-17 LAB — CULTURE, BLOOD (ROUTINE X 2)
Culture: NO GROWTH
Culture: NO GROWTH

## 2016-05-17 LAB — CREATININE, SERUM
CREATININE: 0.81 mg/dL (ref 0.44–1.00)
GFR calc Af Amer: >60 mL/min (ref >60)

## 2016-05-17 LAB — PROCALCITONIN

## 2016-05-17 MED ORDER — DOXYCYCLINE HYCLATE 100 MG PO TABS
100.0000 mg | ORAL_TABLET | Freq: Two times a day (BID) | ORAL | Status: DC
  Administered 2016-05-17 – 2016-05-18 (×3): 100 mg via ORAL
  Filled 2016-05-17 (×3): qty 1

## 2016-05-17 MED ORDER — AMOXICILLIN-POT CLAVULANATE 875-125 MG PO TABS
1.0000 | ORAL_TABLET | Freq: Two times a day (BID) | ORAL | Status: DC
  Administered 2016-05-17 – 2016-05-18 (×3): 1 via ORAL
  Filled 2016-05-17 (×3): qty 1

## 2016-05-17 NOTE — Care Management Important Message (Signed)
Important Message  Patient Details  Name: Donna Nielsen MRN: [id number] Date of Birth: 09/29/1940   Medicare Important Message Given:  Yes    Shelbie Ammons, RN 05/17/2016, 8:28 AM

## 2016-05-17 NOTE — Evaluation (Signed)
Physical Therapy Evaluation Patient Details Name: RYELEE ALBEE MRN: [id number] DOB: 1940-07-31 Today's Date: 05/17/2016   History of Present Illness  76 y/o female who was seen ~ 1 weeks ago after fall.  Went home and returns with increased confusion and some weakness.   Clinical Impression  Patient seen for re-evaluation, she has been ambulating in the hallways with family per notes since previous PT evaluation. Patient does not have family to confirm her status, she seems confused (which appears to be baseline). She does begin reaching for something to hold on to while starting ambulating. While ambulating in the hallways she is quite curious, looking through other rooms and turning her head multiple times with mild loss of balance noted as a result. Given decreased stride length, would recommend RW use at home (unable to inform patient given her confusion). Likely would benefit from HHPT evaluation as well for home set up if she is going to continue to furniture cruise.     Follow Up Recommendations Home health PT    Equipment Recommendations  Rolling walker with 5" wheels    Recommendations for Other Services       Precautions / Restrictions Precautions Precautions: Fall Restrictions Weight Bearing Restrictions: No      Mobility  Bed Mobility                  Transfers Overall transfer level: Independent Equipment used: None             General transfer comment: Patient in recliner to begin session, no LOB noted with transfer.   Ambulation/Gait Ambulation/Gait assistance: Min guard Ambulation Distance (Feet): 200 Feet Assistive device: 1 person hand held assist Gait Pattern/deviations: Drifts right/left;Shuffle   Gait velocity interpretation: <1.8 ft/sec, indicative of risk for recurrent falls General Gait Details: Patient demonstrates moments of good confidence/speed, otherwise very curious looking outside and other rooms with mild loss of balance with  head turns. Required HHA throughout to maintain balance.   Stairs            Wheelchair Mobility    Modified Rankin (Stroke Patients Only)       Balance Overall balance assessment: Modified Independent;History of Falls                               Standardized Balance Assessment Standardized Balance Assessment :  (Modified DGI of 8/12 indicative of falls risk)           Pertinent Vitals/Pain Pain Assessment: No/denies pain    Home Living Family/patient expects to be discharged to:: Private residence Living Arrangements: Children;Other relatives Available Help at Discharge: Family;Available 24 hours/day (nearly 24/7 assist, available if needed) Type of Home: House       Home Layout: Two level        Prior Function Level of Independence: Independent         Comments: generally active and independent     Hand Dominance        Extremity/Trunk Assessment   Upper Extremity Assessment Upper Extremity Assessment: Overall WFL for tasks assessed    Lower Extremity Assessment Lower Extremity Assessment: Overall WFL for tasks assessed       Communication   Communication: No difficulties  Cognition Arousal/Alertness: Awake/alert (Patient confused with R and L, seems generally confused) Behavior During Therapy: Impulsive Overall Cognitive Status: History of cognitive impairments - at baseline  General Comments      Exercises     Assessment/Plan    PT Assessment Patient needs continued PT services  PT Problem List Decreased strength;Decreased balance;Decreased activity tolerance       PT Treatment Interventions DME instruction;Therapeutic activities;Therapeutic exercise;Gait training;Stair training;Balance training;Neuromuscular re-education    PT Goals (Current goals can be found in the Care Plan section)  Acute Rehab PT Goals Patient Stated Goal: To return home  PT Goal Formulation: With  patient Time For Goal Achievement: 05/31/16 Potential to Achieve Goals: Good    Frequency Min 2X/week   Barriers to discharge        Co-evaluation               End of Session Equipment Utilized During Treatment: Gait belt Activity Tolerance: Patient tolerated treatment well Patient left: in chair;with call bell/phone within reach (No chair alarm on when PT entered the room) Nurse Communication: Mobility status PT Visit Diagnosis: Muscle weakness (generalized) (M62.81)         Time: 3958-4417 PT Time Calculation (min) (ACUTE ONLY): 11 min   Charges:   PT Evaluation $PT Re-evaluation: 1 Procedure     PT G Codes:        Royce Macadamia PT, DPT, CSCS    05/17/2016, 3:29 PM

## 2016-05-17 NOTE — Progress Notes (Signed)
Patient Alert x1. Patient was able to go to the bathroom with assistance x1 with the Nurse Tech. Tylenol was given per PRN order for she had a slight temp and skin was very hot to touch. Tolerated medication well. Colostomy emptied x1 earlier in shift. Will continue to monitor patient to end of shift.

## 2016-05-17 NOTE — Progress Notes (Signed)
[place] [facility] [place] NAME: Donna Nielsen    MR#:  [id number]  DATE OF BIRTH:  01/01/1941  SUBJECTIVE:  CHIEF COMPLAINT:   Chief Complaint  Patient presents with  . Fall   -Fevers are better today, T max of 100.14F again last night - more sleepy today  REVIEW OF SYSTEMS:  Review of Systems  Constitutional: Positive for fever. Negative for chills and malaise/fatigue.  HENT: Negative for congestion, ear discharge and hearing loss.   Eyes: Negative for blurred vision.  Respiratory: Negative for cough, shortness of breath and wheezing.   Cardiovascular: Negative for chest pain, palpitations and leg swelling.  Gastrointestinal: Negative for abdominal pain, constipation, diarrhea, nausea and vomiting.  Genitourinary: Negative for dysuria.  Musculoskeletal: Negative for myalgias.  Neurological: Negative for dizziness, sensory change, speech change, focal weakness, seizures and headaches.  Psychiatric/Behavioral: Negative for depression.       Confusion but improving    DRUG ALLERGIES:  No Known Allergies  VITALS:  Blood pressure (!) 129/59, pulse 78, temperature 97.7 F (36.5 C), temperature source Oral, resp. rate 18, height 5\' 4"  (1.626 m), weight 58.1 kg (128 lb), SpO2 100 %.  PHYSICAL EXAMINATION:  Physical Exam  GENERAL:  76 y.o.-year-old patient lying in the bed with no acute distress. Slightly hard of hearing Sleepy today. EYES: Pupils equal, round, reactive to light and accommodation. No scleral icterus. Extraocular muscles intact.  HEENT: Head atraumatic, normocephalic. Oropharynx and nasopharynx clear. Bruising around her eyes Ears look clean. Some fullness in right middle ear NECK:  Supple, no jugular venous distention. No thyroid enlargement, no tenderness.  LUNGS: Normal breath sounds bilaterally, no wheezing, rales,rhonchi or crepitation. No use of accessory muscles of respiration.  CARDIOVASCULAR: S1, S2 normal. No  rubs, or gallops. 2/6 systolic murmur present. ABDOMEN: Soft, nontender, nondistended. Colostomy bag with stool in place. Bowel sounds present. No organomegaly or mass.  EXTREMITIES: No pedal edema, cyanosis, or clubbing.  NEUROLOGIC: Cranial nerves II through XII are intact. Muscle strength 5/5 in all extremities. Sensation intact. Gait not checked.  PSYCHIATRIC: The patient is alert and oriented x 1-2 SKIN: No obvious rash, lesion, or ulcer.    LABORATORY PANEL:   CBC  Recent Labs Lab 05/15/16 0447  WBC 4.4  HGB 11.5*  HCT 33.1*  PLT 119*   ------------------------------------------------------------------------------------------------------------------  Chemistries   Recent Labs Lab 05/11/16 1318  05/16/16 0522 05/17/16 0443  NA 135  < > 134*  --   K 3.3*  < > 4.1  --   CL 95*  < > 100*  --   CO2 31  < > 29  --   GLUCOSE 172*  < > 174*  --   BUN 7  < > 7  --   CREATININE 0.94  < > 0.77 0.81  CALCIUM 8.5*  < > 7.7*  --   AST 17  --   --   --   ALT 14  --   --   --   ALKPHOS 70  --   --   --   BILITOT 0.8  --   --   --   < > = values in this interval not displayed. ------------------------------------------------------------------------------------------------------------------  Cardiac Enzymes  Recent Labs Lab 05/11/16 1318  TROPONINI <0.03   ------------------------------------------------------------------------------------------------------------------  RADIOLOGY:  Mr Jeri Cos Wo Contrast  Result Date: 05/15/2016 CLINICAL DATA:  Progressive confusion. Altered mental status. Trauma 5 days ago. Fever. EXAM: MRI HEAD WITHOUT  AND WITH CONTRAST TECHNIQUE: Multiplanar, multiecho pulse sequences of the brain and surrounding structures were obtained without and with intravenous contrast. CONTRAST:  81mL MULTIHANCE GADOBENATE DIMEGLUMINE 529 MG/ML IV SOLN COMPARISON:  None. FINDINGS: Brain: Mild atrophy and white matter changes are within normal limits for age.  There is mild diffuse dural thickening. No parenchymal enhancement is present. No significant white matter disease is present. The brainstem and cerebellum are normal. Ventricles are proportionate to the degree of atrophy. No significant extra-axial fluid collection is present. Vascular: Flow is present in the major intracranial arteries. Skull and upper cervical spine: Skullbase is within normal limits. The craniocervical junction is normal. The upper cervical spine is unremarkable. Midline sagittal structures are within normal limits. The left supraorbital scalp hematoma is again seen. Sinuses/Orbits: Mild mucosal thickening is present throughout the ethmoid air cells bilaterally and inferior left frontal sinus. Bilateral mastoid effusions are present. No obstructing nasopharyngeal lesion is evident. The patient is status post bilateral lens replacements. The globes and orbits are within normal limits otherwise. IMPRESSION: 1. Mild diffuse dural enhancement. This is likely secondary to recent lumbar puncture. No discrete parenchymal enhancement or extra-axial fluid collection is present. 2. Mild atrophy and white matter disease is within normal limits for age. 3. Mild diffuse ethmoid mucosal thickening without fluid levels. 4. Bilateral mastoid effusions without definite enhancement. No obstructing nasopharyngeal lesion is present. Electronically Signed   By: San Morelle M.D.   On: 05/15/2016 16:26    EKG:   Orders placed or performed during the hospital encounter of 05/11/16  . EKG 12-Lead  . EKG 12-Lead  . ED EKG  . ED EKG    ASSESSMENT AND PLAN:   76 year old female with past medical history significant for Alzheimer's dementia, history of breast cancer, hypothyroidism presents to hospital secondary to an unresponsive episode. Noted to be septic.  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 Fevers seem to be improving but last temp of 100.40F last night - CSF cultures are negative -Blood  cultures are negative. HSV PCR is negative. -No other source of infection identified. Influenza PCR is negative. -Appreciate ID consult.  -on Unasyn and Vancomycin- since fever spikes improving- change to augmentin and doxycycline. Fever of unknown origin so far -Pro calcitonin is elevated - CT of the abdomen and pelvis showing mild enteritis changes. No further diarrhea is noted.  - MRI of the brain with slight meningeal enhancement from LP, and sinus fullness, no abscess or other source of infection noted.  #2 dementia-alert and active at baseline. Remains confused. Sleepy today, discontinue namenda Continue Galantamine. MRI of the brain with no acute findings  #3 glaucoma-continue eyedrops  #4 hypokalemia-replaced  #5 DVT prophylaxis-Lovenox   Appears very weak and unsteady, Physical Therapy to re-evaluate today-grand daughter updated - discharge tomorrow   All the records are reviewed and case discussed with Care Management/Social Workerr. Management plans discussed with the patient, family and they are in agreement.  CODE STATUS: DNR  TOTAL TIME TAKING CARE OF THIS PATIENT: 35 minutes.   POSSIBLE D/C TOMORROW,  DEPENDING ON CLINICAL CONDITION.   Gladstone Lighter M.D on 05/17/2016 at 12:09 PM  Between 7am to 6pm - Pager - [phone]  After 6pm go to [email or web address] - password [email or web address] [facility]  Office  [phone]  CC: Primary care physician; Rusty Aus, MD

## 2016-05-18 LAB — CREATININE, SERUM
Creatinine, Ser: 0.65 mg/dL (ref 0.44–1.00)
GFR calc Af Amer: >60 mL/min (ref >60)
GFR calc non Af Amer: >60 mL/min (ref >60)

## 2016-05-18 MED ORDER — DOXYCYCLINE HYCLATE 100 MG PO TABS: 100.0000 mg | ORAL_TABLET | Freq: Two times a day (BID) | ORAL | 0 refills | Status: DC

## 2016-05-18 MED ORDER — AMOXICILLIN-POT CLAVULANATE 875-125 MG PO TABS: 1.0000 | ORAL_TABLET | Freq: Two times a day (BID) | ORAL | 0 refills | Status: DC

## 2016-05-18 MED ORDER — ENSURE ENLIVE PO LIQD: 237.0000 mL | Freq: Three times a day (TID) | ORAL | 12 refills | Status: DC

## 2016-05-18 NOTE — Progress Notes (Signed)
Pt is being discharged home with [facility]. Discharged papers given and explained to pt's granddaughter. Pt's granddaughter verbalized understanding. Meds and f/u appointments reviewed. RX given.

## 2016-05-18 NOTE — Discharge Summary (Signed)
[place] at [place] NAME: Donna Nielsen    MR#:  [id number]  DATE OF BIRTH:  Sep 18, 1940  DATE OF ADMISSION:  05/11/2016   ADMITTING PHYSICIAN: Hillary Bow, MD  DATE OF DISCHARGE: 05/18/2016  PRIMARY CARE PHYSICIAN: Rusty Aus, MD   ADMISSION DIAGNOSIS:   Meningitis [G03.9]  DISCHARGE DIAGNOSIS:   Active Problems:   Meningitis   Malnutrition of moderate degree   SECONDARY DIAGNOSIS:   Past Medical History:  Diagnosis Date  . Alzheimer disease   . Cancer ([employer])   . History of breast cancer   . History of cervical cancer   . History of colon cancer   . Thyroid disease     HOSPITAL COURSE:   76 year old female with past medical history significant for Alzheimer's dementia, history of breast cancer, hypothyroidism presents to hospital secondary to an unresponsive episode. Noted to be septic.  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 Fevers seem to be improving, none in the last 24hrs - CSF cultures are negative -Blood cultures are negative. HSV PCR is negative. -No other source of infection identified. Influenza PCR is negative. -Appreciate ID consult.  -was on Unasyn and Vancomycin- discharge on augmentin and doxycycline. Fever of unknown origin so far -Pro calcitonin is elevated - CT of the abdomen and pelvis showing mild enteritis changes. No further diarrhea is noted.  - MRI of the brain with slight meningeal enhancement from LP, and sinus fullness, no abscess or other source of infection noted.  #2 dementia-alert and active at baseline. Remains confused. Sleepy, discontinue namenda Continue Galantamine. MRI of the brain with no acute findings  #3 glaucoma-continue eyedrops  #4 hypokalemia-replaced   Physical Therapy  Recommended Home health PT- discharge today  DISCHARGE CONDITIONS:   Guarded  CONSULTS OBTAINED:   Treatment Team:  Leonel Ramsay, MD  DRUG ALLERGIES:   No Known  Allergies DISCHARGE MEDICATIONS:   Allergies as of 05/18/2016   No Known Allergies     Medication List    STOP taking these medications   amoxicillin 500 MG capsule Commonly known as:  AMOXIL   memantine 5 MG tablet Commonly known as:  NAMENDA     TAKE these medications   amoxicillin-clavulanate 875-125 MG tablet Commonly known as:  AUGMENTIN Take 1 tablet by mouth every 12 (twelve) hours. X 7 days   bimatoprost 0.03 % ophthalmic solution Commonly known as:  LUMIGAN Place 1 drop into both eyes at bedtime.   doxycycline 100 MG tablet Commonly known as:  VIBRA-TABS Take 1 tablet (100 mg total) by mouth every 12 (twelve) hours. X 1 more week   feeding supplement (ENSURE ENLIVE) Liqd Take 237 mLs by mouth 3 (three) times daily between meals.   galantamine 8 MG tablet Commonly known as:  RAZADYNE Take 8 mg by mouth 2 (two) times daily.   NASACORT ALLERGY 24HR 55 MCG/ACT Aero nasal inhaler Generic drug:  triamcinolone Place 2 sprays into the nose daily.   timolol 0.5 % ophthalmic solution Commonly known as:  BETIMOL Place 1 drop into both eyes daily.   vitamin B-12 1000 MCG tablet Commonly known as:  CYANOCOBALAMIN Take 1,000 mcg by mouth every other day.            Durable Medical Equipment        Start     Ordered   05/17/16 1106  For home use only DME Walker rolling  Once    Question:  Patient needs a  walker to treat with the following condition  Answer:  Unsteady gait   05/17/16 1105       DISCHARGE INSTRUCTIONS:   1. PCP follow-up in 1-2 weeks 2. ID follow up in 2 weeks  DIET:   Cardiac diet  ACTIVITY:   Activity as tolerated  OXYGEN:   Home Oxygen: No.  Oxygen Delivery: room air  DISCHARGE LOCATION:   home   If you experience worsening of your admission symptoms, develop shortness of breath, life threatening emergency, suicidal or homicidal thoughts you must seek medical attention immediately by calling 911 or calling your MD  immediately  if symptoms less severe.  You Must read complete instructions/literature along with all the possible adverse reactions/side effects for all the Medicines you take and that have been prescribed to you. Take any new Medicines after you have completely understood and accpet all the possible adverse reactions/side effects.   Please note  You were cared for by a hospitalist during your hospital stay. If you have any questions about your discharge medications or the care you received while you were in the hospital after you are discharged, you can call the unit and asked to speak with the hospitalist on call if the hospitalist that took care of you is not available. Once you are discharged, your primary care physician will handle any further medical issues. Please note that NO REFILLS for any discharge medications will be authorized once you are discharged, as it is imperative that you return to your primary care physician (or establish a relationship with a primary care physician if you do not have one) for your aftercare needs so that they can reassess your need for medications and monitor your lab values.    On the day of Discharge:  VITAL SIGNS:   Blood pressure (!) 125/49, pulse 80, temperature 99 F (37.2 C), temperature source Oral, resp. rate 18, height 5\' 4"  (1.626 m), weight 55.8 kg (123 lb 1.6 oz), SpO2 100 %.  PHYSICAL EXAMINATION:    GENERAL:  76 y.o.-year-old patient lying in the bed with no acute distress. Slightly hard of hearing Sleepy today. EYES: Pupils equal, round, reactive to light and accommodation. No scleral icterus. Extraocular muscles intact.  HEENT: Head atraumatic, normocephalic. Oropharynx and nasopharynx clear. Bruising around her eyes Ears look clean. Some fullness in right middle ear NECK:  Supple, no jugular venous distention. No thyroid enlargement, no tenderness.  LUNGS: Normal breath sounds bilaterally, no wheezing, rales,rhonchi or crepitation. No  use of accessory muscles of respiration.  CARDIOVASCULAR: S1, S2 normal. No rubs, or gallops. 2/6 systolic murmur present. ABDOMEN: Soft, nontender, nondistended. Colostomy bag with stool in place. Bowel sounds present. No organomegaly or mass.  EXTREMITIES: No pedal edema, cyanosis, or clubbing.  NEUROLOGIC: Cranial nerves II through XII are intact. Muscle strength 5/5 in all extremities. Sensation intact. Gait not checked.  PSYCHIATRIC: The patient is alert and oriented x 1-2 SKIN: No obvious rash, lesion, or ulcer.   DATA REVIEW:   CBC  Recent Labs Lab 05/15/16 0447  WBC 4.4  HGB 11.5*  HCT 33.1*  PLT 119*    Chemistries   Recent Labs Lab 05/11/16 1318  05/16/16 0522  05/18/16 0540  NA 135  < > 134*  --   --   K 3.3*  < > 4.1  --   --   CL 95*  < > 100*  --   --   CO2 31  < > 29  --   --  GLUCOSE 172*  < > 174*  --   --   BUN 7  < > 7  --   --   CREATININE 0.94  < > 0.77  < > 0.65  CALCIUM 8.5*  < > 7.7*  --   --   AST 17  --   --   --   --   ALT 14  --   --   --   --   ALKPHOS 70  --   --   --   --   BILITOT 0.8  --   --   --   --   < > = values in this interval not displayed.   Microbiology Results  Results for orders placed or performed during the hospital encounter of 05/11/16  CSF culture     Status: None   Collection Time: 05/11/16  6:12 PM  Result Value Ref Range Status   Specimen Description CSF  Final   Special Requests NONE  Final   Gram Stain NO ORGANISMS SEEN FEW WBC   Final   Culture   Final    NO GROWTH 3 DAYS Performed at [facility], [address], [place], [place] [address]    Report Status 05/15/2016 FINAL  Final  CULTURE, BLOOD (ROUTINE X 2) w Reflex to ID Panel     Status: None   Collection Time: 05/12/16  6:55 PM  Result Value Ref Range Status   Specimen Description BLOOD RIGHT AC  Final   Special Requests BOTTLES DRAWN AEROBIC AND ANAEROBIC BCAV  Final   Culture NO GROWTH 5 DAYS  Final   Report Status 05/17/2016 FINAL   Final  CULTURE, BLOOD (ROUTINE X 2) w Reflex to ID Panel     Status: None   Collection Time: 05/12/16  6:55 PM  Result Value Ref Range Status   Specimen Description BLOOD RIGHT HAND  Final   Special Requests BOTTLES DRAWN AEROBIC AND ANAEROBIC BCAV  Final   Culture NO GROWTH 5 DAYS  Final   Report Status 05/17/2016 FINAL  Final  Culture, blood (Routine X 2) w Reflex to ID Panel     Status: None (Preliminary result)   Collection Time: 05/14/16  3:18 PM  Result Value Ref Range Status   Specimen Description BLOOD RIGHT AC  Final   Special Requests BOTTLES DRAWN AEROBIC AND ANAEROBIC BCAV  Final   Culture NO GROWTH 4 DAYS  Final   Report Status PENDING  Incomplete  Culture, blood (Routine X 2) w Reflex to ID Panel     Status: None (Preliminary result)   Collection Time: 05/14/16  3:34 PM  Result Value Ref Range Status   Specimen Description BLOOD RIGHTWRIST  Final   Special Requests BOTTLES DRAWN AEROBIC AND ANAEROBIC BCAV  Final   Culture NO GROWTH 4 DAYS  Final   Report Status PENDING  Incomplete    RADIOLOGY:  No results found.   Management plans discussed with the patient, family and they are in agreement.  CODE STATUS:     Code Status Orders        Start     Ordered   05/11/16 2027  Do not attempt resuscitation (DNR)  Continuous    Question Answer Comment  In the event of cardiac or respiratory ARREST Do not call a "code blue"   In the event of cardiac or respiratory ARREST Do not perform Intubation, CPR, defibrillation or ACLS   In the event of  cardiac or respiratory ARREST Use medication by any route, position, wound care, and other measures to relive pain and suffering. May use oxygen, suction and manual treatment of airway obstruction as needed for comfort.      05/11/16 2027    Code Status History    Date Active Date Inactive Code Status Order ID Comments User Context   This patient has a current code status but no historical code status.    Advance Directive  Documentation   Flowsheet Row Most Recent Value  Type of Advance Directive  Healthcare Power of Attorney  Pre-existing out of facility DNR order (yellow form or pink MOST form)  No data  "MOST" Form in Place?  No data      TOTAL TIME TAKING CARE OF THIS PATIENT: 38 minutes.    Gladstone Lighter M.D on 05/18/2016 at 10:46 AM  Between 7am to 6pm - Pager - [phone]  After 6pm go to [email or web address] - Proofreader  [facility]  Office  [phone]  CC: Primary care physician; Rusty Aus, MD   Note: This dictation was prepared with Dragon dictation along with smaller phrase technology. Any transcriptional errors that result from this process are unintentional.

## 2016-05-18 NOTE — Care Management (Signed)
Physical therapy evaluation completed. Recommending home with home health and physical therapy. Discussed agencies with granddaughter at the bedside. [place]. [place], [facility] representative. [facility] unable to go to [place]. [facility] will provide rolling walker. Telephone call to [place] at [facility]. Will fax information to [facility].  Discharge to home today per Dr. Tressia Miners. Shelbie Ammons RN MSN CCM Care Management

## 2016-05-19 LAB — CULTURE, BLOOD (ROUTINE X 2)
CULTURE: NO GROWTH
CULTURE: NO GROWTH

## 2019-04-30 DIAGNOSIS — Z209 Contact with and (suspected) exposure to unspecified communicable disease: Secondary | ICD-10-CM | POA: Diagnosis not present

## 2019-04-30 DIAGNOSIS — R0902 Hypoxemia: Secondary | ICD-10-CM | POA: Diagnosis not present

## 2019-04-30 DIAGNOSIS — G3189 Other specified degenerative diseases of nervous system: Secondary | ICD-10-CM | POA: Diagnosis not present

## 2019-04-30 DIAGNOSIS — R4182 Altered mental status, unspecified: Secondary | ICD-10-CM | POA: Diagnosis not present

## 2019-04-30 DIAGNOSIS — R531 Weakness: Secondary | ICD-10-CM | POA: Diagnosis not present

## 2019-04-30 DIAGNOSIS — U071 COVID-19: Secondary | ICD-10-CM | POA: Diagnosis not present

## 2019-04-30 DIAGNOSIS — I700 Atherosclerosis of aorta: Secondary | ICD-10-CM | POA: Diagnosis not present

## 2019-04-30 DIAGNOSIS — R404 Transient alteration of awareness: Secondary | ICD-10-CM | POA: Diagnosis not present

## 2019-04-30 DIAGNOSIS — J439 Emphysema, unspecified: Secondary | ICD-10-CM | POA: Diagnosis not present

## 2019-04-30 DIAGNOSIS — N390 Urinary tract infection, site not specified: Secondary | ICD-10-CM | POA: Diagnosis not present

## 2019-04-30 DIAGNOSIS — I6782 Cerebral ischemia: Secondary | ICD-10-CM | POA: Diagnosis not present

## 2019-04-30 DIAGNOSIS — M47812 Spondylosis without myelopathy or radiculopathy, cervical region: Secondary | ICD-10-CM | POA: Diagnosis not present

## 2019-04-30 DIAGNOSIS — R7989 Other specified abnormal findings of blood chemistry: Secondary | ICD-10-CM | POA: Diagnosis not present

## 2019-04-30 DIAGNOSIS — B9689 Other specified bacterial agents as the cause of diseases classified elsewhere: Secondary | ICD-10-CM | POA: Diagnosis not present

## 2019-04-30 DIAGNOSIS — I6523 Occlusion and stenosis of bilateral carotid arteries: Secondary | ICD-10-CM | POA: Diagnosis not present

## 2019-05-01 DIAGNOSIS — I6782 Cerebral ischemia: Secondary | ICD-10-CM | POA: Diagnosis not present

## 2019-05-01 DIAGNOSIS — M7989 Other specified soft tissue disorders: Secondary | ICD-10-CM | POA: Diagnosis not present

## 2019-05-01 DIAGNOSIS — R5381 Other malaise: Secondary | ICD-10-CM | POA: Diagnosis not present

## 2019-05-01 DIAGNOSIS — R7989 Other specified abnormal findings of blood chemistry: Secondary | ICD-10-CM | POA: Diagnosis not present

## 2019-05-01 DIAGNOSIS — J1282 Pneumonia due to coronavirus disease 2019: Secondary | ICD-10-CM | POA: Diagnosis not present

## 2019-05-01 DIAGNOSIS — G3189 Other specified degenerative diseases of nervous system: Secondary | ICD-10-CM | POA: Diagnosis not present

## 2019-05-01 DIAGNOSIS — M47812 Spondylosis without myelopathy or radiculopathy, cervical region: Secondary | ICD-10-CM | POA: Diagnosis not present

## 2019-05-01 DIAGNOSIS — N390 Urinary tract infection, site not specified: Secondary | ICD-10-CM | POA: Diagnosis not present

## 2019-05-01 DIAGNOSIS — M79661 Pain in right lower leg: Secondary | ICD-10-CM | POA: Diagnosis not present

## 2019-05-01 DIAGNOSIS — G9341 Metabolic encephalopathy: Secondary | ICD-10-CM | POA: Diagnosis not present

## 2019-05-01 DIAGNOSIS — I700 Atherosclerosis of aorta: Secondary | ICD-10-CM | POA: Diagnosis not present

## 2019-05-01 DIAGNOSIS — B9689 Other specified bacterial agents as the cause of diseases classified elsewhere: Secondary | ICD-10-CM | POA: Diagnosis not present

## 2019-05-01 DIAGNOSIS — G309 Alzheimer's disease, unspecified: Secondary | ICD-10-CM | POA: Diagnosis not present

## 2019-05-01 DIAGNOSIS — J439 Emphysema, unspecified: Secondary | ICD-10-CM | POA: Diagnosis not present

## 2019-05-01 DIAGNOSIS — R4182 Altered mental status, unspecified: Secondary | ICD-10-CM | POA: Diagnosis not present

## 2019-05-01 DIAGNOSIS — Z66 Do not resuscitate: Secondary | ICD-10-CM | POA: Diagnosis not present

## 2019-05-01 DIAGNOSIS — R531 Weakness: Secondary | ICD-10-CM | POA: Diagnosis not present

## 2019-05-01 DIAGNOSIS — U071 COVID-19: Secondary | ICD-10-CM | POA: Diagnosis not present

## 2019-05-01 DIAGNOSIS — I6523 Occlusion and stenosis of bilateral carotid arteries: Secondary | ICD-10-CM | POA: Diagnosis not present

## 2019-05-01 DIAGNOSIS — N3090 Cystitis, unspecified without hematuria: Secondary | ICD-10-CM | POA: Diagnosis not present

## 2019-05-01 DIAGNOSIS — F0280 Dementia in other diseases classified elsewhere without behavioral disturbance: Secondary | ICD-10-CM | POA: Diagnosis not present

## 2019-05-01 DIAGNOSIS — E873 Alkalosis: Secondary | ICD-10-CM | POA: Diagnosis not present

## 2019-05-08 DIAGNOSIS — M6281 Muscle weakness (generalized): Secondary | ICD-10-CM | POA: Diagnosis not present

## 2019-05-08 DIAGNOSIS — J1282 Pneumonia due to coronavirus disease 2019: Secondary | ICD-10-CM | POA: Diagnosis not present

## 2019-05-08 DIAGNOSIS — I700 Atherosclerosis of aorta: Secondary | ICD-10-CM | POA: Diagnosis not present

## 2019-05-08 DIAGNOSIS — N390 Urinary tract infection, site not specified: Secondary | ICD-10-CM | POA: Diagnosis not present

## 2019-05-08 DIAGNOSIS — A419 Sepsis, unspecified organism: Secondary | ICD-10-CM | POA: Diagnosis not present

## 2019-05-08 DIAGNOSIS — Z8541 Personal history of malignant neoplasm of cervix uteri: Secondary | ICD-10-CM | POA: Diagnosis not present

## 2019-05-08 DIAGNOSIS — U071 COVID-19: Secondary | ICD-10-CM | POA: Diagnosis not present

## 2019-05-08 DIAGNOSIS — F0280 Dementia in other diseases classified elsewhere without behavioral disturbance: Secondary | ICD-10-CM | POA: Diagnosis not present

## 2019-05-08 DIAGNOSIS — F0281 Dementia in other diseases classified elsewhere with behavioral disturbance: Secondary | ICD-10-CM | POA: Diagnosis not present

## 2019-05-08 DIAGNOSIS — R2689 Other abnormalities of gait and mobility: Secondary | ICD-10-CM | POA: Diagnosis not present

## 2019-05-08 DIAGNOSIS — E876 Hypokalemia: Secondary | ICD-10-CM | POA: Diagnosis not present

## 2019-05-08 DIAGNOSIS — R32 Unspecified urinary incontinence: Secondary | ICD-10-CM | POA: Diagnosis not present

## 2019-05-08 DIAGNOSIS — G309 Alzheimer's disease, unspecified: Secondary | ICD-10-CM | POA: Diagnosis not present

## 2019-05-08 DIAGNOSIS — Z85038 Personal history of other malignant neoplasm of large intestine: Secondary | ICD-10-CM | POA: Diagnosis not present

## 2019-05-08 DIAGNOSIS — G039 Meningitis, unspecified: Secondary | ICD-10-CM | POA: Diagnosis not present

## 2019-05-08 DIAGNOSIS — Z7952 Long term (current) use of systemic steroids: Secondary | ICD-10-CM | POA: Diagnosis not present

## 2019-05-09 DIAGNOSIS — E119 Type 2 diabetes mellitus without complications: Secondary | ICD-10-CM | POA: Diagnosis not present

## 2019-05-09 DIAGNOSIS — F0280 Dementia in other diseases classified elsewhere without behavioral disturbance: Secondary | ICD-10-CM | POA: Diagnosis not present

## 2019-05-09 DIAGNOSIS — G301 Alzheimer's disease with late onset: Secondary | ICD-10-CM | POA: Diagnosis not present

## 2019-05-09 DIAGNOSIS — Z433 Encounter for attention to colostomy: Secondary | ICD-10-CM | POA: Diagnosis not present

## 2019-05-09 DIAGNOSIS — U071 COVID-19: Secondary | ICD-10-CM | POA: Diagnosis not present

## 2019-05-09 DIAGNOSIS — G309 Alzheimer's disease, unspecified: Secondary | ICD-10-CM | POA: Diagnosis not present

## 2019-05-09 DIAGNOSIS — J1282 Pneumonia due to coronavirus disease 2019: Secondary | ICD-10-CM | POA: Diagnosis not present

## 2019-05-11 DIAGNOSIS — I700 Atherosclerosis of aorta: Secondary | ICD-10-CM | POA: Diagnosis not present

## 2019-05-11 DIAGNOSIS — Z7952 Long term (current) use of systemic steroids: Secondary | ICD-10-CM | POA: Diagnosis not present

## 2019-05-11 DIAGNOSIS — G309 Alzheimer's disease, unspecified: Secondary | ICD-10-CM | POA: Diagnosis not present

## 2019-05-11 DIAGNOSIS — N390 Urinary tract infection, site not specified: Secondary | ICD-10-CM | POA: Diagnosis not present

## 2019-05-11 DIAGNOSIS — R32 Unspecified urinary incontinence: Secondary | ICD-10-CM | POA: Diagnosis not present

## 2019-05-11 DIAGNOSIS — J1282 Pneumonia due to coronavirus disease 2019: Secondary | ICD-10-CM | POA: Diagnosis not present

## 2019-05-11 DIAGNOSIS — F0280 Dementia in other diseases classified elsewhere without behavioral disturbance: Secondary | ICD-10-CM | POA: Diagnosis not present

## 2019-05-11 DIAGNOSIS — U071 COVID-19: Secondary | ICD-10-CM | POA: Diagnosis not present

## 2019-05-11 DIAGNOSIS — E876 Hypokalemia: Secondary | ICD-10-CM | POA: Diagnosis not present

## 2019-06-07 DEATH — deceased

## 2024-03-09 ENCOUNTER — Other Ambulatory Visit: Payer: Self-pay
